# Patient Record
Sex: Female | Born: 1945 | Race: White | Hispanic: No | Marital: Married | Smoking: Never smoker
Health system: Southern US, Community
[De-identification: ages and names within clinical notes are randomized; demographics above are authoritative.]

## PROBLEM LIST (undated history)

## (undated) DIAGNOSIS — D649 Anemia, unspecified: Secondary | ICD-10-CM

## (undated) DIAGNOSIS — J312 Chronic pharyngitis: Secondary | ICD-10-CM

## (undated) DIAGNOSIS — Z8711 Personal history of peptic ulcer disease: Secondary | ICD-10-CM

## (undated) DIAGNOSIS — M810 Age-related osteoporosis without current pathological fracture: Secondary | ICD-10-CM

## (undated) DIAGNOSIS — L0291 Cutaneous abscess, unspecified: Secondary | ICD-10-CM

## (undated) DIAGNOSIS — Z8719 Personal history of other diseases of the digestive system: Secondary | ICD-10-CM

## (undated) DIAGNOSIS — K760 Fatty (change of) liver, not elsewhere classified: Secondary | ICD-10-CM

## (undated) DIAGNOSIS — J3489 Other specified disorders of nose and nasal sinuses: Secondary | ICD-10-CM

## (undated) DIAGNOSIS — K8020 Calculus of gallbladder without cholecystitis without obstruction: Secondary | ICD-10-CM

## (undated) DIAGNOSIS — R0683 Snoring: Secondary | ICD-10-CM

## (undated) DIAGNOSIS — I10 Essential (primary) hypertension: Secondary | ICD-10-CM

## (undated) DIAGNOSIS — L0390 Cellulitis, unspecified: Secondary | ICD-10-CM

## (undated) DIAGNOSIS — K602 Anal fissure, unspecified: Secondary | ICD-10-CM

## (undated) DIAGNOSIS — K5790 Diverticulosis of intestine, part unspecified, without perforation or abscess without bleeding: Secondary | ICD-10-CM

## (undated) HISTORY — DX: Diverticulosis of intestine, part unspecified, without perforation or abscess without bleeding: K57.90

## (undated) HISTORY — DX: Personal history of other diseases of the digestive system: Z87.19

## (undated) HISTORY — DX: Anemia, unspecified: D64.9

## (undated) HISTORY — PX: BREAST BIOPSY: SHX20

## (undated) HISTORY — PX: DILATION AND CURETTAGE OF UTERUS: SHX78

## (undated) HISTORY — DX: Cutaneous abscess, unspecified: L02.91

## (undated) HISTORY — DX: Cellulitis, unspecified: L03.90

## (undated) HISTORY — PX: ROTATOR CUFF REPAIR: SHX139

## (undated) HISTORY — DX: Personal history of peptic ulcer disease: Z87.11

## (undated) HISTORY — PX: PARTIAL HYSTERECTOMY: SHX80

## (undated) HISTORY — DX: Calculus of gallbladder without cholecystitis without obstruction: K80.20

## (undated) HISTORY — DX: Fatty (change of) liver, not elsewhere classified: K76.0

## (undated) HISTORY — DX: Anal fissure, unspecified: K60.2

## (undated) HISTORY — DX: Age-related osteoporosis without current pathological fracture: M81.0

## (undated) HISTORY — DX: Essential (primary) hypertension: I10

## (undated) HISTORY — DX: Other specified disorders of nose and nasal sinuses: J34.89

---

## 1983-05-07 HISTORY — PX: OTHER SURGICAL HISTORY: SHX169

## 1991-05-07 HISTORY — PX: BLADDER REPAIR: SHX76

## 1991-05-07 HISTORY — PX: LAPAROSCOPIC ENDOMETRIOSIS FULGURATION: SUR769

## 1998-05-06 HISTORY — PX: CHOLECYSTECTOMY: SHX55

## 1999-06-25 ENCOUNTER — Encounter: Payer: Self-pay | Admitting: Family Medicine

## 1999-06-25 ENCOUNTER — Encounter: Admission: RE | Admit: 1999-06-25 | Discharge: 1999-06-25 | Payer: Self-pay | Admitting: Family Medicine

## 2003-02-03 ENCOUNTER — Other Ambulatory Visit: Admission: RE | Admit: 2003-02-03 | Discharge: 2003-02-03 | Payer: Self-pay | Admitting: Obstetrics and Gynecology

## 2004-05-29 ENCOUNTER — Other Ambulatory Visit: Admission: RE | Admit: 2004-05-29 | Discharge: 2004-05-29 | Payer: Self-pay | Admitting: Obstetrics and Gynecology

## 2004-05-30 ENCOUNTER — Ambulatory Visit (HOSPITAL_COMMUNITY): Admission: RE | Admit: 2004-05-30 | Discharge: 2004-05-30 | Payer: Self-pay | Admitting: Obstetrics and Gynecology

## 2004-05-30 ENCOUNTER — Encounter: Admission: RE | Admit: 2004-05-30 | Discharge: 2004-05-30 | Payer: Self-pay | Admitting: Obstetrics and Gynecology

## 2004-06-20 ENCOUNTER — Ambulatory Visit: Payer: Self-pay | Admitting: Internal Medicine

## 2004-09-04 ENCOUNTER — Ambulatory Visit: Payer: Self-pay | Admitting: Internal Medicine

## 2004-09-18 ENCOUNTER — Ambulatory Visit: Payer: Self-pay | Admitting: Internal Medicine

## 2004-09-28 ENCOUNTER — Ambulatory Visit: Payer: Self-pay | Admitting: Internal Medicine

## 2004-09-28 HISTORY — PX: COLONOSCOPY: SHX174

## 2004-12-28 ENCOUNTER — Ambulatory Visit: Payer: Self-pay | Admitting: Internal Medicine

## 2005-04-12 ENCOUNTER — Ambulatory Visit: Payer: Self-pay | Admitting: Internal Medicine

## 2005-06-18 ENCOUNTER — Other Ambulatory Visit: Admission: RE | Admit: 2005-06-18 | Discharge: 2005-06-18 | Payer: Self-pay | Admitting: Obstetrics and Gynecology

## 2006-02-19 ENCOUNTER — Ambulatory Visit: Payer: Self-pay | Admitting: Internal Medicine

## 2006-08-12 ENCOUNTER — Ambulatory Visit: Payer: Self-pay | Admitting: Cardiology

## 2006-08-25 ENCOUNTER — Encounter: Payer: Self-pay | Admitting: Cardiovascular Disease

## 2006-08-25 ENCOUNTER — Ambulatory Visit: Payer: Self-pay

## 2006-09-02 ENCOUNTER — Ambulatory Visit: Payer: Self-pay | Admitting: Internal Medicine

## 2007-07-03 ENCOUNTER — Ambulatory Visit: Payer: Self-pay | Admitting: Internal Medicine

## 2007-07-03 DIAGNOSIS — M810 Age-related osteoporosis without current pathological fracture: Secondary | ICD-10-CM

## 2007-07-03 DIAGNOSIS — J069 Acute upper respiratory infection, unspecified: Secondary | ICD-10-CM | POA: Insufficient documentation

## 2007-07-03 HISTORY — DX: Age-related osteoporosis without current pathological fracture: M81.0

## 2007-07-22 ENCOUNTER — Ambulatory Visit: Payer: Self-pay | Admitting: Internal Medicine

## 2007-07-22 DIAGNOSIS — N390 Urinary tract infection, site not specified: Secondary | ICD-10-CM | POA: Insufficient documentation

## 2007-07-22 LAB — CONVERTED CEMR LAB
Bilirubin Urine: NEGATIVE
Glucose, Urine, Semiquant: NEGATIVE
Ketones, urine, test strip: NEGATIVE
Nitrite: NEGATIVE
Specific Gravity, Urine: <1.005
Urobilinogen, UA: NEGATIVE
pH: 6.5

## 2008-05-06 HISTORY — PX: SHOULDER ARTHROSCOPY: SHX128

## 2008-12-26 ENCOUNTER — Ambulatory Visit: Payer: Self-pay | Admitting: Family Medicine

## 2009-03-11 ENCOUNTER — Ambulatory Visit: Payer: Self-pay | Admitting: Family Medicine

## 2009-03-11 DIAGNOSIS — J019 Acute sinusitis, unspecified: Secondary | ICD-10-CM | POA: Insufficient documentation

## 2009-06-02 ENCOUNTER — Ambulatory Visit: Payer: Self-pay | Admitting: Internal Medicine

## 2009-06-02 DIAGNOSIS — J3489 Other specified disorders of nose and nasal sinuses: Secondary | ICD-10-CM

## 2009-06-02 HISTORY — DX: Other specified disorders of nose and nasal sinuses: J34.89

## 2009-07-04 ENCOUNTER — Ambulatory Visit: Payer: Self-pay | Admitting: Internal Medicine

## 2009-07-04 DIAGNOSIS — L0390 Cellulitis, unspecified: Secondary | ICD-10-CM

## 2009-07-04 DIAGNOSIS — L0291 Cutaneous abscess, unspecified: Secondary | ICD-10-CM | POA: Insufficient documentation

## 2009-07-04 HISTORY — DX: Cutaneous abscess, unspecified: L02.91

## 2009-07-04 HISTORY — DX: Cellulitis, unspecified: L03.90

## 2010-01-03 ENCOUNTER — Encounter: Admission: RE | Admit: 2010-01-03 | Discharge: 2010-01-03 | Payer: Self-pay | Admitting: Obstetrics and Gynecology

## 2010-06-05 NOTE — Assessment & Plan Note (Signed)
Summary: POSSIBLE EAR INFECTION // RS   Vital Signs:  Patient profile:   65 year old female Weight:      181 pounds Temp:     98.8 degrees F oral BP sitting:   168 / 90  (left arm) Cuff size:   regular  Vitals Entered By: Raechel Ache, RN (June 02, 2009 4:11 PM) CC: C/o head and ear pressure- recent flying.   CC:  C/o head and ear pressure- recent flying.Marland Kitchen  History of Present Illness:  65 year old patient who presents with a several day history of sinus pressure congestion.  she has also noted some discomfort in both ears.  She has been doing a considerable amount of flying.  Denies any fever, purulent drainage, hearing loss.  Denies cough or shortness of breath.  She has had some sinus infections in the past.  Denies any localized sinus pain or dental pain  Allergies: No Known Drug Allergies  Past History:  Past Medical History: Reviewed history from 07/03/2007 and no changes required. Thyroid Problem Osteoporosis endometriosis  Review of Systems  The patient denies anorexia, fever, weight loss, weight gain, vision loss, decreased hearing, hoarseness, chest pain, syncope, dyspnea on exertion, peripheral edema, prolonged cough, headaches, hemoptysis, abdominal pain, melena, severe indigestion/heartburn, hematuria, incontinence, genital sores, muscle weakness, suspicious skin lesions, transient blindness, difficulty walking, depression, unusual weight change, abnormal bleeding, enlarged lymph nodes, angioedema, and breast masses.    Physical Exam  General:  Well-developed,well-nourished,in no acute distress; alert,appropriate and cooperative throughout examination Head:  Normocephalic and atraumatic without obvious abnormalities. No apparent alopecia or balding. Eyes:  No corneal or conjunctival inflammation noted. EOMI. Perrla. Funduscopic exam benign, without hemorrhages, exudates or papilledema. Vision grossly normal. Ears:  External ear exam shows no significant  lesions or deformities.  Otoscopic examination reveals clear canals, tympanic membranes are intact bilaterally without bulging, retraction, inflammation or discharge. Hearing is grossly normal bilaterally. Nose:  External nasal examination shows no deformity or inflammation. Nasal mucosa are pink and moist without lesions or exudates. Mouth:  Oral mucosa and oropharynx without lesions or exudates.  Teeth in good repair. Neck:  No deformities, masses, or tenderness noted. Lungs:  Normal respiratory effort, chest expands symmetrically. Lungs are clear to auscultation, no crackles or wheezes.   Impression & Recommendations:  Problem # 1:  OTHER DISEASES OF NASAL CAVITY AND SINUSES (ICD-478.19)  Problem # 2:  URI (ICD-465.9)  Complete Medication List: 1)  Evista 60 Mg Tabs (Raloxifene hcl) .Marland Kitchen.. 1 once daily 2)  Nasonex 50 Mcg/act Susp (Mometasone furoate) .... Used twice daily  Patient Instructions: 1)  to use Afrin nasal spray twice daily for 3 to 4 days 2)  Nasonex twice daily 3)  Use warm moist compresses, and over the counter decongestants ( only as directed). Call if no improvement in 5-7 days, sooner if increasing pain, fever, or new symptoms. 4)  Check your Blood Pressure regularly. If it is above:  150/90 you should make an appointment.

## 2010-06-05 NOTE — Assessment & Plan Note (Signed)
Summary: stomach red/painful/njr   Vital Signs:  Patient profile:   65 year old Jensen Weight:      180 pounds Temp:     98.7 degrees F oral BP sitting:   122 / 74  (right arm) Cuff size:   regular  Vitals Entered By: Duard Brady LPN (July 04, 1608 4:00 PM) CC: c/o red rash and pain on/at old abd surgical site (1993) , using otc hydrocort cream - not improving Is Patient Diabetic? No   CC:  c/o red rash and pain on/at old abd surgical site (1993)  and using otc hydrocort cream - not improving.  History of Present Illness: 65 year old patient who is seen today complaining of a painful rash involving her lower abdominal region.  She has had  multiple operations in her lower abdominal region.  She first noticed some redness about 7 days ago, and this has progressively worsened and more recently has become more painful.  Denies any fever, chills, or other systemic complaints.  She has been using topical cortisone without much benefit. She has osteoporosis, which has been treated with Evista.  She is on no new medications  Preventive Screening-Counseling & Management  Alcohol-Tobacco     Smoking Status: never  Allergies (verified): No Known Drug Allergies  Past History:  Past Medical History: Reviewed history from 02/Jamie/2009 and no changes required. Thyroid Problem Osteoporosis endometriosis  Review of Systems       The patient complains of suspicious skin lesions.  The patient denies anorexia, fever, weight loss, weight gain, vision loss, decreased hearing, hoarseness, chest pain, syncope, dyspnea on exertion, peripheral edema, prolonged cough, headaches, hemoptysis, abdominal pain, melena, hematochezia, severe indigestion/heartburn, hematuria, incontinence, genital sores, muscle weakness, transient blindness, difficulty walking, depression, unusual weight change, abnormal bleeding, enlarged lymph nodes, angioedema, and breast masses.    Physical Exam  General:   overweight-appearing.  ad 30 her aoverweight-appearing.   Abdomen:  a transverse scar was noted in the lower abdominal region; there is erythema associated with the scar, especially at either end.  There is tenderness along the entire length of the scar, but there was an area of increased induration and discomfort involving the left superior aspect of the scar.   Impression & Recommendations:  Problem # 1:  CELLULITIS/ABSCESS NOS (ICD-682.9)  Her updated medication list for this problem includes:    Cephalexin 500 Mg Caps (Cephalexin) ..... One three times daily  Her updated medication list for this problem includes:    Cephalexin 500 Mg Caps (Cephalexin) ..... One three times daily  Problem # 2:  OSTEOPOROSIS (ICD-733.00)  Her updated medication list for this problem includes:    Evista 60 Mg Tabs (Raloxifene hcl) .Marland Kitchen... 1 once daily  Her updated medication list for this problem includes:    Evista 60 Mg Tabs (Raloxifene hcl) .Marland Kitchen... 1 once daily  Complete Medication List: 1)  Evista 60 Mg Tabs (Raloxifene hcl) .Marland Kitchen.. 1 once daily 2)  Nasonex 50 Mcg/act Susp (Mometasone furoate) .... Used twice daily 3)  Miconazole Nitrate 2 % Crea (Miconazole nitrate) .... Use twice daily 4)  Cephalexin 500 Mg Caps (Cephalexin) .... One three times daily  Patient Instructions: 1)  Take your antibiotic as prescribed until ALL of it is gone, but stop if you develop a rash or swelling and contact our office as soon as possible. 2)  Take 400-600mg  of Ibuprofen (Advil, Motrin) with food every 4-6 hours as needed for relief of pain or comfort of fever.  Prescriptions: CEPHALEXIN 500 MG CAPS (CEPHALEXIN) one three times daily  #30 x 0   Entered and Authorized by:   Gordy Savers  MD   Signed by:   Gordy Savers  MD on 07/04/2009   Method used:   Print then Give to Patient   RxID:   [id number] MICONAZOLE NITRATE 2 % CREA (MICONAZOLE NITRATE) use twice daily  #60 gm x 0   Entered and  Authorized by:   Gordy Savers  MD   Signed by:   Gordy Savers  MD on 07/04/2009   Method used:   Print then Give to Patient   RxID:   [id number]

## 2010-06-12 ENCOUNTER — Encounter: Payer: Self-pay | Admitting: Internal Medicine

## 2010-06-12 ENCOUNTER — Ambulatory Visit (INDEPENDENT_AMBULATORY_CARE_PROVIDER_SITE_OTHER): Payer: Federal, State, Local not specified - PPO | Admitting: Internal Medicine

## 2010-06-12 VITALS — BP 118/78 | Temp 98.5°F | Ht 61.0 in | Wt 185.0 lb

## 2010-06-12 DIAGNOSIS — J069 Acute upper respiratory infection, unspecified: Secondary | ICD-10-CM

## 2010-06-12 MED ORDER — HYDROCODONE-HOMATROPINE 5-1.5 MG/5ML PO SYRP: 2.5000 mL | ORAL_SOLUTION | Freq: Four times a day (QID) | ORAL | Status: AC | PRN

## 2010-06-12 NOTE — Patient Instructions (Signed)
Get plenty of rest, Drink lots of  clear liquids, and use Tylenol or ibuprofen for fever and discomfort.    Call or return to clinic prn if these symptoms worsen or fail to improve as anticipated.  

## 2010-06-12 NOTE — Progress Notes (Signed)
  Subjective:    Patient ID: Jamie Jensen, female    DOB: 10/28/1945, 65 y.o.   MRN: [id number]  HPI  65 year old patient who presents with a 7-day history of URI symptoms.  She has had head and chest congestion with dry, nonproductive cough.  She has felt achy.  There is been some low-grade fever, but not in excess of 100 degrees.  She has been taking Mucinex, which has been helpful.  Cough is interfered with sleep.  She does have a history of allergic rhinitis.  She has osteoporosis in the present time takes no medications other than he vista   Review of Systems  Constitutional: Negative.   HENT: Negative for hearing loss, congestion, sore throat, rhinorrhea, dental problem, sinus pressure and tinnitus.   Eyes: Negative for pain, discharge and visual disturbance.  Respiratory: Positive for cough. Negative for shortness of breath.   Cardiovascular: Negative for chest pain, palpitations and leg swelling.  Gastrointestinal: Negative for nausea, vomiting, abdominal pain, diarrhea, constipation, blood in stool and abdominal distention.  Genitourinary: Negative for dysuria, urgency, frequency, hematuria, flank pain, vaginal bleeding, vaginal discharge, difficulty urinating, vaginal pain and pelvic pain.  Musculoskeletal: Negative for joint swelling, arthralgias and gait problem.  Skin: Negative for rash.  Neurological: Negative for dizziness, syncope, speech difficulty, weakness, numbness and headaches.  Hematological: Negative for adenopathy. Does not bruise/bleed easily.  Psychiatric/Behavioral: Negative for behavioral problems, dysphoric mood and agitation. The patient is not nervous/anxious.        Objective:   Physical Exam  Constitutional: She is oriented to person, place, and time. She appears well-developed and well-nourished.  HENT:  Head: Normocephalic.  Right Ear: External ear normal.  Left Ear: External ear normal.  Mouth/Throat: Oropharynx is clear and moist.  Eyes:  Conjunctivae and EOM are normal. Pupils are equal, round, and reactive to light.  Neck: Normal range of motion. Neck supple. No thyromegaly present.  Cardiovascular: Normal rate, regular rhythm, normal heart sounds and intact distal pulses.   Pulmonary/Chest: Effort normal and breath sounds normal.  Abdominal: Soft. Bowel sounds are normal. She exhibits no mass. There is no tenderness.  Musculoskeletal: Normal range of motion.  Lymphadenopathy:    She has no cervical adenopathy.  Neurological: She is alert and oriented to person, place, and time.  Skin: Skin is warm and dry. No rash noted.  Psychiatric: She has a normal mood and affect. Her behavior is normal.          Assessment & Plan:  Lower URI-will treat symptomatically.  A prescription for Hydromet, dispensed.

## 2010-09-21 NOTE — Assessment & Plan Note (Signed)
[facility]                            CARDIOLOGY OFFICE NOTE   MARY, SECORD                      MRN:          [id number]  DATE:08/12/2006                            DOB:          09/15/1945    I was asked by Dr. Thressa Sheller to evaluate Jamie Jensen with several month  history of dizzy spells and tachy palpitations.   HISTORY OF PRESENT ILLNESS:  She is 65 years of age, married, and has 2  children. She works as an Retail buyer.   She travels quite a bit and most recently has been on the road 3 to 4  nights a week. She says she is very exhausted.   She has noted several episodes where she is sitting and talking to an  agent, feeling dizzy and lightheaded. She has no other neurological  symptoms. She then feels like she might pass out,  which triggers a  cough. She coughs a couple of times and then comes around.   She denies any chest pressure, shortness of breath, diaphoresis, nausea  or vomiting or tachy palpitations during those spells. She says when she  comes back around completely, she does feel her heart beating fast. She  subsequently gets a little light headache.   She is very athletic and enjoys dancing, skiing, and other vigorous  activities. She has never had any symptoms of syncope or presyncope with  these kinds of activities.   She has no previous cardiac history. She has had no previous heart  murmur. There is no family history of premature coronary disease or  collapse or syncope.   PAST MEDICAL HISTORY:  She has no history of allergies.   CURRENT MEDICATIONS:  Evista and Fosamax once a week.  She takes a  multivitamin.   She does not smoke. She has 1 glass of wine per week. She drinks 2 cups  of coffee a day.   PAST SURGICAL HISTORY:  1. C-section.  2. Hysterectomy.  3. Two breast biopsies.  4. Colonoscopy.  5. Cholecystectomy.   FAMILY HISTORY:  Is really noncontributory, as mentioned above.   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 She  has 2 children. She is on her second marriage.   REVIEW OF SYSTEMS:  Other than the HPI, she has some fatigue and just  feels exhausted.   She had blood work in the past year by Dr. Henderson Cloud, which she says was  normal.   There is no history of thyroid disease.   Her blood pressure today is 121/87, pulse 68 and regular. Her height is  5 feet 1 inch. She weighs 175.  HEENT: Normocephalic, atraumatic. PERRLA. Extraocular movements intact.  Sclerae are clear. Facial symmetry is normal.  NECK: Supple. Carotid upstrokes are equal bilaterally without bruits.  There is no JVD.  Thyroid is not enlarged. Trachea is midline.  LUNGS:  Are clear to auscultation.  HEART: Reveals a nondisplaced PMI. She has normal S1, S2 without  gallop, rub, or murmur.  ABDOMEN: Soft with good bowel sounds. There is no  midline bruit. There  is no hepatomegaly.  EXTREMITIES: Reveals no cyanosis, clubbing or edema. Pulses are intact.  NEURO: Grossly intact.  SKIN: Intact.   EKG shows normal sinus rhythm with poor R progression across the  anterior precordium, which is probably not significant.   ASSESSMENT:  1. Dizziness with presyncope.  2. Tachy palpitations after these events and also spontaneously      sometimes at night.   PLAN:  1. A 2D echo to rule out any structural heart disease.  2. CardioNet monitor.     Thomas C. Daleen Squibb, MD, [facility]  Electronically Signed    TCW/MedQ  DD: 08/12/2006  DT: 08/12/2006  Job #: [id number]   cc:   Guy Sandifer. Henderson Cloud, M.D.  Gordy Savers, MD

## 2010-10-02 ENCOUNTER — Ambulatory Visit (INDEPENDENT_AMBULATORY_CARE_PROVIDER_SITE_OTHER): Payer: Federal, State, Local not specified - PPO | Admitting: Internal Medicine

## 2010-10-02 ENCOUNTER — Telehealth: Payer: Self-pay | Admitting: Internal Medicine

## 2010-10-02 ENCOUNTER — Encounter: Payer: Self-pay | Admitting: Internal Medicine

## 2010-10-02 VITALS — BP 118/80 | Temp 98.8°F | Wt 188.0 lb

## 2010-10-02 DIAGNOSIS — T6391XA Toxic effect of contact with unspecified venomous animal, accidental (unintentional), initial encounter: Secondary | ICD-10-CM

## 2010-10-02 DIAGNOSIS — T63441A Toxic effect of venom of bees, accidental (unintentional), initial encounter: Secondary | ICD-10-CM

## 2010-10-02 NOTE — Patient Instructions (Signed)
Force fluids  Take Allegra or Alavert daily  Call if unimproved  Call or return to clinic prn if these symptoms worsen or fail to improve as anticipated.

## 2010-10-02 NOTE — Telephone Encounter (Signed)
Appt scheduled with Dr. Kirtland Bouchard today.

## 2010-10-02 NOTE — Progress Notes (Signed)
  Subjective:    Patient ID: Jamie Jensen, female    DOB: [birth year], 65 y.o.   MRN: [id number]  HPI 65 year old patient who is seen today following multiple hornet stings yesterday. These occurred on the facial region especially about the right eye and also heard extremities. She also states that for the past couple of weeks she has had number episodes of lightheadedness and near syncope. She takes no medications that might aggravate hypotension   Review of Systems     Objective:   Physical Exam  Constitutional: She is oriented to person, place, and time. She appears well-developed and well-nourished.       Blood pressure 120/80  HENT:  Head: Normocephalic.  Right Ear: External ear normal.  Left Ear: External ear normal.  Mouth/Throat: Oropharynx is clear and moist.  Eyes: Conjunctivae and EOM are normal. Pupils are equal, round, and reactive to light.  Neck: Normal range of motion. Neck supple. No thyromegaly present.  Cardiovascular: Normal rate, regular rhythm, normal heart sounds and intact distal pulses.   Pulmonary/Chest: Effort normal and breath sounds normal.  Abdominal: Soft. Bowel sounds are normal. She exhibits no mass. There is no tenderness.  Musculoskeletal: Normal range of motion.  Lymphadenopathy:    She has no cervical adenopathy.  Neurological: She is alert and oriented to person, place, and time.  Skin: Skin is warm and dry. No rash noted.       Right periorbital swelling she had areas of soft tissue swelling involving multiple areas on her arms and left leg  Psychiatric: She has a normal mood and affect. Her behavior is normal.          Assessment & Plan:  Multiple hornet stings  We'll continue with the antihistamines. It was suggested that she try a once a day nonsedating antihistamine such as Allegra or Alavert. She will be treated with 80 mg of Depo-Medrol today  She will force fluids with the warmer weather. If she experiences any further near  syncope will recontact the office

## 2010-10-02 NOTE — Telephone Encounter (Signed)
Pt got stung by hornets yesterday. Has 2 hornet stings over rt eye. Vision slightly impaired due to swelling. Pt also has one sting on arm and one on leg. Pt is wondering what she could do?

## 2010-11-26 ENCOUNTER — Other Ambulatory Visit (HOSPITAL_COMMUNITY): Payer: Self-pay | Admitting: Obstetrics and Gynecology

## 2010-11-26 DIAGNOSIS — R102 Pelvic and perineal pain: Secondary | ICD-10-CM

## 2010-11-27 ENCOUNTER — Other Ambulatory Visit: Payer: Self-pay | Admitting: Obstetrics and Gynecology

## 2010-11-28 ENCOUNTER — Ambulatory Visit (HOSPITAL_COMMUNITY)
Admission: RE | Admit: 2010-11-28 | Discharge: 2010-11-28 | Disposition: A | Discharge status: Home / Self Care | Payer: Federal, State, Local not specified - PPO | Source: Ambulatory Visit | Attending: Obstetrics and Gynecology | Admitting: Obstetrics and Gynecology

## 2010-11-28 DIAGNOSIS — Z90710 Acquired absence of both cervix and uterus: Secondary | ICD-10-CM | POA: Insufficient documentation

## 2010-11-28 DIAGNOSIS — K7689 Other specified diseases of liver: Secondary | ICD-10-CM | POA: Insufficient documentation

## 2010-11-28 DIAGNOSIS — R102 Pelvic and perineal pain: Secondary | ICD-10-CM

## 2010-11-28 DIAGNOSIS — E279 Disorder of adrenal gland, unspecified: Secondary | ICD-10-CM | POA: Insufficient documentation

## 2010-11-28 DIAGNOSIS — K5730 Diverticulosis of large intestine without perforation or abscess without bleeding: Secondary | ICD-10-CM | POA: Insufficient documentation

## 2010-11-28 DIAGNOSIS — N949 Unspecified condition associated with female genital organs and menstrual cycle: Secondary | ICD-10-CM | POA: Insufficient documentation

## 2010-11-28 MED ORDER — IOHEXOL 300 MG/ML  SOLN
100.0000 mL | Freq: Once | INTRAMUSCULAR | Status: AC | PRN
  Administered 2010-11-28: 100 mL via INTRAVENOUS

## 2012-07-17 ENCOUNTER — Other Ambulatory Visit: Payer: Self-pay | Admitting: Internal Medicine

## 2012-07-17 DIAGNOSIS — E049 Nontoxic goiter, unspecified: Secondary | ICD-10-CM

## 2012-07-24 ENCOUNTER — Ambulatory Visit
Admission: RE | Admit: 2012-07-24 | Discharge: 2012-07-24 | Disposition: A | Discharge status: Home / Self Care | Payer: Federal, State, Local not specified - PPO | Source: Ambulatory Visit | Attending: Internal Medicine | Admitting: Internal Medicine

## 2012-07-24 DIAGNOSIS — E049 Nontoxic goiter, unspecified: Secondary | ICD-10-CM

## 2012-07-29 ENCOUNTER — Other Ambulatory Visit: Payer: Self-pay | Admitting: Internal Medicine

## 2012-07-29 DIAGNOSIS — E049 Nontoxic goiter, unspecified: Secondary | ICD-10-CM

## 2012-08-05 ENCOUNTER — Encounter: Payer: Self-pay | Admitting: Cardiovascular Disease

## 2012-08-07 ENCOUNTER — Ambulatory Visit (INDEPENDENT_AMBULATORY_CARE_PROVIDER_SITE_OTHER): Payer: Federal, State, Local not specified - PPO | Admitting: Cardiovascular Disease

## 2012-08-07 ENCOUNTER — Encounter: Payer: Self-pay | Admitting: Cardiovascular Disease

## 2012-08-07 VITALS — BP 120/80 | HR 89 | Wt 179.0 lb

## 2012-08-07 DIAGNOSIS — R55 Syncope and collapse: Secondary | ICD-10-CM | POA: Insufficient documentation

## 2012-08-07 DIAGNOSIS — R0602 Shortness of breath: Secondary | ICD-10-CM

## 2012-08-07 DIAGNOSIS — E049 Nontoxic goiter, unspecified: Secondary | ICD-10-CM | POA: Insufficient documentation

## 2012-08-07 NOTE — Progress Notes (Signed)
Patient ID: Jamie Jensen, female   DOB: [birth year], 67 y.o.   MRN: [id number] 67 y.o. referred by Dr Renne Crigler for presyncope.  History of obesity and HTN Over the years has had steriotypical episodes of recumbant resting syncope.  No palpitations chest pain. Feels like she is going out and then gets headache and some mild dyspnea.  Post event feels heart beating hard and fast. Previous w/u over 5 years ago normal No chestp pain Walks at brisk pace regularly with no issues. Spells are not related to exercise, postural changes.  Last one this Sunday at church and felt better in about 30 mintues  Has a multinodular gointer No TSH on file [place] with no worrisome nodules  ROS: Denies fever, malais, weight loss, blurry vision, decreased visual acuity, cough, sputum, SOB, hemoptysis, pleuritic pain, palpitaitons, heartburn, abdominal pain, melena, lower extremity edema, claudication, or rash.  All other systems reviewed and negative   General: Affect appropriate Healthy:  appears stated age HEENT: normal Neck supple with no adenopathy JVP normal no bruits no thyromegaly Lungs clear with no wheezing and good diaphragmatic motion Heart:  S1/S2 no murmur,rub, gallop or click PMI normal Abdomen: benighn, BS positve, no tenderness, no AAA no bruit.  No HSM or HJR Distal pulses intact with no bruits No edema Neuro non-focal Skin warm and dry No muscular weakness  Medications Current Outpatient Prescriptions  Medication Sig Dispense Refill  . triamterene-hydrochlorothiazide (DYAZIDE) 37.5-25 MG per capsule Take 1 capsule by mouth every morning.       No current facility-administered medications for this visit.    Allergies Review of patient's allergies indicates no known allergies.  Family History: No family history on file.  Social History: History   Social History  . Marital Status: Married    Spouse Name: N/A    Number of Children: N/A  . Years of Education: N/A   Occupational History  .  Not on file.   Social History Main Topics  . Smoking status: Never Smoker   . Smokeless tobacco: Not on file  . Alcohol Use: Yes     Comment: occasionaly  . Drug Use: No  . Sexually Active: Not on file   Other Topics Concern  . Not on file   Social History Narrative  . No narrative on file    Electrocardiogram:  SR rte 89 LAD PAC poor R wave progression  Assessment and Plan

## 2012-08-07 NOTE — Assessment & Plan Note (Signed)
Given fairly normal ECG and exam and nonexertional symptoms not likely to be related to heart.  F/U stress echo to document normal HR and BP response and r/o structural heart disease

## 2012-08-07 NOTE — Assessment & Plan Note (Signed)
Encouraged her to have TSH/T4 with Dr Renne Crigler.  Left lobe palpable but no worrisome nodules on exam

## 2012-08-07 NOTE — Patient Instructions (Addendum)
Your physician recommends that you schedule a follow-up appointment in:   AS NEEDED   Your physician recommends that you continue on your current medications as directed. Please refer to the Current Medication list given to you today.   Your physician has requested that you have a stress echocardiogram. For further information please visit [email or web address]. Please follow instruction sheet as given.  

## 2012-08-17 ENCOUNTER — Encounter: Payer: Self-pay | Admitting: Cardiovascular Disease

## 2012-08-17 ENCOUNTER — Ambulatory Visit (HOSPITAL_COMMUNITY): Payer: Federal, State, Local not specified - PPO | Attending: Cardiovascular Disease

## 2012-08-17 ENCOUNTER — Ambulatory Visit (HOSPITAL_COMMUNITY): Payer: Federal, State, Local not specified - PPO | Attending: Cardiovascular Disease | Admitting: Radiology

## 2012-08-17 DIAGNOSIS — R0989 Other specified symptoms and signs involving the circulatory and respiratory systems: Secondary | ICD-10-CM

## 2012-08-17 DIAGNOSIS — R55 Syncope and collapse: Secondary | ICD-10-CM | POA: Insufficient documentation

## 2012-08-17 NOTE — Progress Notes (Signed)
Ms. Stress Echocardiogram performed.

## 2013-03-04 ENCOUNTER — Telehealth: Payer: Self-pay | Admitting: Internal Medicine

## 2013-03-04 NOTE — Telephone Encounter (Signed)
Patient has seen her PCP and was given medications for GERD. She continues to have RUQ pain right under her rib cage. Offered OV tomorrow or 03/11/13. Patient could not do these OV. She is going out of town until week of Thanksgiving. Scheduled on 03/29/13 at 8:15 AM.

## 2013-03-10 ENCOUNTER — Encounter: Payer: Self-pay | Admitting: *Deleted

## 2013-03-29 ENCOUNTER — Ambulatory Visit (INDEPENDENT_AMBULATORY_CARE_PROVIDER_SITE_OTHER): Payer: Medicare Other | Admitting: Internal Medicine

## 2013-03-29 ENCOUNTER — Other Ambulatory Visit (INDEPENDENT_AMBULATORY_CARE_PROVIDER_SITE_OTHER): Payer: Medicare Other

## 2013-03-29 ENCOUNTER — Encounter: Payer: Self-pay | Admitting: Internal Medicine

## 2013-03-29 VITALS — BP 132/80 | HR 76 | Ht 61.0 in | Wt 184.0 lb

## 2013-03-29 DIAGNOSIS — R1031 Right lower quadrant pain: Secondary | ICD-10-CM | POA: Diagnosis not present

## 2013-03-29 DIAGNOSIS — K624 Stenosis of anus and rectum: Secondary | ICD-10-CM

## 2013-03-29 DIAGNOSIS — R1013 Epigastric pain: Secondary | ICD-10-CM

## 2013-03-29 MED ORDER — HYOSCYAMINE SULFATE 0.125 MG SL SUBL: 0.1250 mg | SUBLINGUAL_TABLET | SUBLINGUAL | Status: DC | PRN

## 2013-03-29 MED ORDER — OMEPRAZOLE 20 MG PO CPDR: 20.0000 mg | DELAYED_RELEASE_CAPSULE | Freq: Every day | ORAL | Status: DC

## 2013-03-29 NOTE — Progress Notes (Signed)
Jamie Jensen 1945-09-04 MRN [id number]  History of Present Illness:  This is a 67 year old, white female with chronic right lower quadrant abdominal pain which is sharp, localized anteriorly and worse with walking. It also bothers her at rest; for instance, she woke up with it this morning.  She also has epigastric discomfort and heartburn associated with occasional solid food dysphagia. We saw her in 2006 for a screening colonoscopy which showed mild diverticulosis of the left colon. 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 She underwent a cholecystectomy within the last 5 years. She describes being constipated and having occasional diarrhea as well. She denies rectal bleeding. There is a history of an anal fissure. Her weight has remained stable, overweight. There is a history of severe endometriosis necessitating multiple surgeries. She has had 2 C-sections, a vaginal hysterectomy and resection of endometriomas in [place] approximately 15 years ago. She had a postoperative complication of perforated bladder which necessitated a suprapubic catheter and subsequent reoperation. She was told to have pelvic adhesions.   Past Medical History  Diagnosis Date  . Other diseases of nasal cavity and sinuses(478.19) 06/02/2009  . CELLULITIS/ABSCESS NOS 07/04/2009  . OSTEOPOROSIS 07/03/2007  . Endometriosis   . Diverticulosis   . Anal fissure   . Gallstones   . Hypertension    Past Surgical History  Procedure Laterality Date  . Cesarean section      x 2  . Partial hysterectomy    . Breast biopsy      x 2  . Cholecystectomy    . Complete hysterectomy      following partial hysterectomy  . Laparoscopic endometriosis fulguration      with subsequent bladder perforation  . Bladder repair      post perforation during surgery for endometriosis    reports that she has never smoked. She does not have any smokeless tobacco history on file.  She reports that she drinks alcohol. She reports that she does not use illicit drugs. family history includes Breast cancer in her maternal aunt; Heart disease in her father; Kidney cancer in her mother; Pancreatic cancer in her mother; Prostate cancer in an other family member. No Known Allergies      Review of Systems: Occasional solid food dysphagia. Heartburn, epigastric pain. Diarrhea and constipation  The remainder of the 10 point ROS is negative except as outlined in H&P   Physical Exam: General appearance  Well developed, in no distress. Eyes- non icteric. HEENT nontraumatic, normocephalic. Mouth no lesions, tongue papillated, no cheilosis. Neck supple without adenopathy, goiter right lobe of the thyroid, no carotid bruits, no JVD. Lungs Clear to auscultation bilaterally. Cor normal S1, normal S2, regular rhythm, no murmur,  quiet precordium. Abdomen: Soft and relaxed, mildly obese. Multiple laparoscopic scars. Normoactive bowel sounds. Tender along her right colon from right lower to right upper quadrant. No palpable mass. Liver edge at coatal margin. No CVA tenderness. Rectal: Anal stenosis. Painful digital exam. Small amount of Hemoccult negative stool. Extremities no pedal edema. Skin no lesions. Neurological alert and oriented x 3. Psychological normal mood and affect.  Assessment and Plan:  Problem #100 67 year old white female with intermittent right lower quadrant abdominal pain which could be multifactorial in that she has a history of severe endometriosis and most likely has adhesions. She also has functional constipation which is most likely contributing to the abdominal discomfort. She has anal stenosis on my exam today  which creates an additional problem with evacuation. She will start Colace 100 mg daily and we will give her Levsin sublingual 0.125 mg by mouth. For her right lower quadrant abdominal pain, we will proceed with a CT scan of the abdomen and pelvis to look  for malignancy as well as followup on the adrenal lesion.   Problem #2 Dysphagia and epigastric pain. I suspect a hiatal hernia or esophagitis. We will start her on omeprazole 20 mg daily and will schedule an upper endoscopy to rule out peptic ulcer disease.  Problem #3 Goiter. She has an appointment with Dr. Talmage Nap for evaluation.  Problem #4 Colorectal screening. Patient's last colonoscopy was in May 2006. A recall colonoscopy will be due in May 2016 or earlier if the right lower quadrant abdominal pain persists.   03/29/2013 Jamie Jensen

## 2013-03-29 NOTE — Patient Instructions (Addendum)
You have been scheduled for an endoscopy with propofol. Please follow written instructions given to you at your visit today. If you use inhalers (even only as needed), please bring them with you on the day of your procedure. Your physician has requested that you go to www.startemmi.com and enter the access code given to you at your visit today. This web site gives a general overview about your procedure. However, you should still follow specific instructions given to you by our office regarding your preparation for the procedure.  Your physician has requested that you go to the basement for the following lab work before leaving today: BUN, Creatinine  We have sent the following medications to your pharmacy for you to pick up at your convenience: Omeprazole Levsin  You have been scheduled for a CT scan of the abdomen and pelvis at [facility] ([address]---this is in the same building as [facility]).   You are scheduled on 03/30/13 at 10:00 am. You should arrive 15 minutes prior to your appointment time for registration. Please follow the written instructions below on the day of your exam:  WARNING: IF YOU ARE ALLERGIC TO IODINE/X-RAY DYE, PLEASE NOTIFY RADIOLOGY IMMEDIATELY AT [phone]! YOU WILL BE GIVEN A 13 HOUR PREMEDICATION PREP.  1) Do not eat or drink anything after 6:00 am (4 hours prior to your test) 2) You have been given 2 bottles of oral contrast to drink. The solution may taste better if refrigerated, but do NOT add ice or any other liquid to this solution. Shake well before drinking.    Drink 1 bottle of contrast @ 8:00 am (2 hours prior to your exam)  Drink 1 bottle of contrast @ 9:00 am (1 hour prior to your exam)  You may take any medications as prescribed with a small amount of water except for the following: Metformin, Glucophage, Glucovance, Avandamet, Riomet, Fortamet, Actoplus Met, Janumet, Glumetza or Metaglip. The above medications must be  held the day of the exam AND 48 hours after the exam.  The purpose of you drinking the oral contrast is to aid in the visualization of your intestinal tract. The contrast solution may cause some diarrhea. Before your exam is started, you will be given a small amount of fluid to drink. Depending on your individual set of symptoms, you may also receive an intravenous injection of x-ray contrast/dye. Plan on being at [facility] for 30 minutes or long, depending on the type of exam you are having performed.  This test typically takes 30-45 minutes to complete.  If you have any questions regarding your exam or if you need to reschedule, you may call the CT department at [phone] between the hours of 8:00 am and 5:00 pm, Monday-Friday.  ________________________________________________________________________ CC: Dr Renne Crigler

## 2013-03-30 ENCOUNTER — Ambulatory Visit (INDEPENDENT_AMBULATORY_CARE_PROVIDER_SITE_OTHER)
Admission: RE | Admit: 2013-03-30 | Discharge: 2013-03-30 | Disposition: A | Discharge status: Home / Self Care | Payer: Medicare Other | Source: Ambulatory Visit | Attending: Internal Medicine | Admitting: Internal Medicine

## 2013-03-30 DIAGNOSIS — R1031 Right lower quadrant pain: Secondary | ICD-10-CM | POA: Diagnosis not present

## 2013-03-30 MED ORDER — IOHEXOL 300 MG/ML  SOLN
100.0000 mL | Freq: Once | INTRAMUSCULAR | Status: AC | PRN
  Administered 2013-03-30: 100 mL via INTRAVENOUS

## 2013-03-31 ENCOUNTER — Ambulatory Visit (AMBULATORY_SURGERY_CENTER): Payer: Medicare Other | Admitting: Internal Medicine

## 2013-03-31 ENCOUNTER — Encounter: Payer: Self-pay | Admitting: Internal Medicine

## 2013-03-31 VITALS — BP 102/52 | HR 59 | Temp 97.0°F | Resp 17 | Ht 61.0 in | Wt 184.0 lb

## 2013-03-31 DIAGNOSIS — K2100 Gastro-esophageal reflux disease with esophagitis, without bleeding: Secondary | ICD-10-CM | POA: Diagnosis not present

## 2013-03-31 DIAGNOSIS — K2960 Other gastritis without bleeding: Secondary | ICD-10-CM | POA: Diagnosis not present

## 2013-03-31 DIAGNOSIS — M810 Age-related osteoporosis without current pathological fracture: Secondary | ICD-10-CM | POA: Diagnosis not present

## 2013-03-31 DIAGNOSIS — E049 Nontoxic goiter, unspecified: Secondary | ICD-10-CM | POA: Diagnosis not present

## 2013-03-31 DIAGNOSIS — D131 Benign neoplasm of stomach: Secondary | ICD-10-CM

## 2013-03-31 DIAGNOSIS — K209 Esophagitis, unspecified without bleeding: Secondary | ICD-10-CM

## 2013-03-31 DIAGNOSIS — R1031 Right lower quadrant pain: Secondary | ICD-10-CM

## 2013-03-31 DIAGNOSIS — R109 Unspecified abdominal pain: Secondary | ICD-10-CM | POA: Diagnosis not present

## 2013-03-31 MED ORDER — SODIUM CHLORIDE 0.9 % IV SOLN: 500.0000 mL | INTRAVENOUS | Status: DC

## 2013-03-31 NOTE — Progress Notes (Signed)
Report to pacu rn, vss, bbs=clear 

## 2013-03-31 NOTE — Patient Instructions (Signed)

## 2013-03-31 NOTE — Progress Notes (Signed)
Called to room to assist during endoscopic procedure.  Patient ID and intended procedure confirmed with present staff. Received instructions for my participation in the procedure from the performing physician.  

## 2013-03-31 NOTE — Progress Notes (Signed)
Patient did not experience any of the following events: a burn prior to discharge; a fall within the facility; wrong site/side/patient/procedure/implant event; or a hospital transfer or hospital admission upon discharge from the facility. (G8907) Patient did not have preoperative order for IV antibiotic SSI prophylaxis. (G8918)  

## 2013-03-31 NOTE — Op Note (Signed)
[facility] [address] [place], [address]   ENDOSCOPY PROCEDURE REPORT  PATIENT: Jamie, Jensen  MR#: [id number] BIRTHDATE: 25-Jun-1945 , 67  yrs. old GENDER: Female ENDOSCOPIST: Hart Carwin, MD REFERRED BY:  Romero Liner, M.D. PROCEDURE DATE:  03/31/2013 PROCEDURE:  EGD w/ biopsy ASA CLASS:     Class II INDICATIONS:  Epigastric pain.   Dysphagia. hx of chole, on Omeprazole 20 mg/day MEDICATIONS: MAC sedation, administered by CRNA and propofol (Diprivan) 150mg  IV TOPICAL ANESTHETIC: Cetacaine Spray  DESCRIPTION OF PROCEDURE: After the risks benefits and alternatives of the procedure were thoroughly explained, informed consent was obtained.  The LB [id number] endoscope was introduced through the mouth and advanced to the second portion of the duodenum. Without limitations.  The instrument was slowly withdrawn as the mucosa was fully examined.      Esophagus: proximal and mid-esophageal mucosa appeared normal. There were several linear erosions and superficial ulcerations at the GE junction. Erosions measured at least 5 mm. There was no stricture. There was edema at the GE junction. Multiple biopsies were obtained to rule out Barrett's esophagus there was no significant hiatal hernia Stomach: Body of the stomach and rugal folds were unremarkable. There were multiple erosions in the gastric antrum and a small 3 mm shallow ulceration at the prepyloric antrum. There was no signs of bleeding. Pyloric outlet was normal. Retroflexion of the endoscope revealed normal fundus and cardia. Biopsies were taken from the gastric antrum to rule out H. pylori  Duodenum: Duodenal bulb and descending duodenum were unremarkable[         The scope was then withdrawn from the patient and the procedure completed.  COMPLICATIONS: There were no complications. ENDOSCOPIC IMPRESSION: [1. grade 2 esophagitis status post biopsies 2. antral gastritis with erosions  and small superficial ulceration status post biopsies to rule out H. pyloriI RECOMMENDATIONS: 1.  Await biopsy results 2.   increase omeprazole to 40 mg a day she is currently on 20 mg a day.  REPEAT EXAM: for EGD pending biopsy results.  eSigned:  Hart Carwin, MD 03/31/2013 10:40 AM   CC:  PATIENT NAME:  Jamie, Jensen MR#: [id number]

## 2013-04-05 ENCOUNTER — Telehealth: Payer: Self-pay | Admitting: *Deleted

## 2013-04-05 NOTE — Telephone Encounter (Signed)
No answer, left message to call if questions or concerns. 

## 2013-04-06 ENCOUNTER — Encounter: Payer: Self-pay | Admitting: Internal Medicine

## 2013-04-19 ENCOUNTER — Other Ambulatory Visit: Payer: Self-pay | Admitting: Internal Medicine

## 2013-04-21 DIAGNOSIS — E049 Nontoxic goiter, unspecified: Secondary | ICD-10-CM | POA: Diagnosis not present

## 2013-04-22 ENCOUNTER — Other Ambulatory Visit: Payer: Self-pay | Admitting: Endocrinology

## 2013-04-22 DIAGNOSIS — E049 Nontoxic goiter, unspecified: Secondary | ICD-10-CM

## 2013-04-26 ENCOUNTER — Ambulatory Visit
Admission: RE | Admit: 2013-04-26 | Discharge: 2013-04-26 | Disposition: A | Discharge status: Home / Self Care | Payer: Medicare Other | Source: Ambulatory Visit | Attending: Endocrinology | Admitting: Endocrinology

## 2013-04-26 DIAGNOSIS — E042 Nontoxic multinodular goiter: Secondary | ICD-10-CM | POA: Diagnosis not present

## 2013-04-26 DIAGNOSIS — E049 Nontoxic goiter, unspecified: Secondary | ICD-10-CM

## 2013-05-12 DIAGNOSIS — Z130 Encounter for screening for diseases of the blood and blood-forming organs and certain disorders involving the immune mechanism: Secondary | ICD-10-CM | POA: Diagnosis not present

## 2013-05-12 DIAGNOSIS — Z1231 Encounter for screening mammogram for malignant neoplasm of breast: Secondary | ICD-10-CM | POA: Diagnosis not present

## 2013-05-12 DIAGNOSIS — Z124 Encounter for screening for malignant neoplasm of cervix: Secondary | ICD-10-CM | POA: Diagnosis not present

## 2013-05-22 ENCOUNTER — Other Ambulatory Visit: Payer: Self-pay | Admitting: Internal Medicine

## 2013-05-24 ENCOUNTER — Other Ambulatory Visit: Payer: Self-pay | Admitting: *Deleted

## 2013-05-24 MED ORDER — OMEPRAZOLE 40 MG PO CPDR: 40.0000 mg | DELAYED_RELEASE_CAPSULE | Freq: Every day | ORAL | Status: DC

## 2013-06-09 DIAGNOSIS — K259 Gastric ulcer, unspecified as acute or chronic, without hemorrhage or perforation: Secondary | ICD-10-CM | POA: Diagnosis not present

## 2013-06-09 DIAGNOSIS — J019 Acute sinusitis, unspecified: Secondary | ICD-10-CM | POA: Diagnosis not present

## 2013-06-16 DIAGNOSIS — J343 Hypertrophy of nasal turbinates: Secondary | ICD-10-CM | POA: Diagnosis not present

## 2013-06-16 DIAGNOSIS — J3501 Chronic tonsillitis: Secondary | ICD-10-CM | POA: Diagnosis not present

## 2013-06-16 DIAGNOSIS — J310 Chronic rhinitis: Secondary | ICD-10-CM | POA: Diagnosis not present

## 2013-06-16 DIAGNOSIS — J342 Deviated nasal septum: Secondary | ICD-10-CM | POA: Diagnosis not present

## 2013-07-26 DIAGNOSIS — M810 Age-related osteoporosis without current pathological fracture: Secondary | ICD-10-CM | POA: Diagnosis not present

## 2013-07-26 DIAGNOSIS — K219 Gastro-esophageal reflux disease without esophagitis: Secondary | ICD-10-CM | POA: Diagnosis not present

## 2013-07-26 DIAGNOSIS — I10 Essential (primary) hypertension: Secondary | ICD-10-CM | POA: Diagnosis not present

## 2013-07-30 DIAGNOSIS — I10 Essential (primary) hypertension: Secondary | ICD-10-CM | POA: Diagnosis not present

## 2013-07-30 DIAGNOSIS — M810 Age-related osteoporosis without current pathological fracture: Secondary | ICD-10-CM | POA: Diagnosis not present

## 2013-07-30 DIAGNOSIS — Z0000 Encounter for general adult medical examination without abnormal findings: Secondary | ICD-10-CM | POA: Diagnosis not present

## 2013-07-30 DIAGNOSIS — D239 Other benign neoplasm of skin, unspecified: Secondary | ICD-10-CM | POA: Diagnosis not present

## 2013-07-30 DIAGNOSIS — R0681 Apnea, not elsewhere classified: Secondary | ICD-10-CM | POA: Diagnosis not present

## 2013-08-02 DIAGNOSIS — J3501 Chronic tonsillitis: Secondary | ICD-10-CM | POA: Diagnosis not present

## 2013-08-03 ENCOUNTER — Other Ambulatory Visit: Payer: Self-pay | Admitting: Otolaryngology

## 2013-08-03 ENCOUNTER — Encounter (HOSPITAL_BASED_OUTPATIENT_CLINIC_OR_DEPARTMENT_OTHER): Payer: Self-pay | Admitting: *Deleted

## 2013-08-03 NOTE — Progress Notes (Signed)
Pt has chronic sore throat and infections-does snore-has never had a sleep study-had recent labs and ekg dr pharr-called for notes

## 2013-08-09 ENCOUNTER — Ambulatory Visit (HOSPITAL_BASED_OUTPATIENT_CLINIC_OR_DEPARTMENT_OTHER)
Admission: RE | Admit: 2013-08-09 | Discharge: 2013-08-09 | Disposition: A | Discharge status: Home / Self Care | Payer: Medicare Other | Source: Ambulatory Visit | Attending: Otolaryngology | Admitting: Otolaryngology

## 2013-08-09 ENCOUNTER — Ambulatory Visit (HOSPITAL_BASED_OUTPATIENT_CLINIC_OR_DEPARTMENT_OTHER): Payer: Medicare Other | Admitting: Anesthesiology

## 2013-08-09 ENCOUNTER — Encounter (HOSPITAL_BASED_OUTPATIENT_CLINIC_OR_DEPARTMENT_OTHER): Payer: Self-pay | Admitting: *Deleted

## 2013-08-09 ENCOUNTER — Encounter (HOSPITAL_BASED_OUTPATIENT_CLINIC_OR_DEPARTMENT_OTHER): Payer: Medicare Other | Admitting: Anesthesiology

## 2013-08-09 ENCOUNTER — Encounter (HOSPITAL_BASED_OUTPATIENT_CLINIC_OR_DEPARTMENT_OTHER)
Admission: RE | Disposition: A | Discharge status: Home / Self Care | Payer: Self-pay | Source: Ambulatory Visit | Attending: Otolaryngology

## 2013-08-09 DIAGNOSIS — J351 Hypertrophy of tonsils: Secondary | ICD-10-CM | POA: Diagnosis not present

## 2013-08-09 DIAGNOSIS — Z9089 Acquired absence of other organs: Secondary | ICD-10-CM

## 2013-08-09 DIAGNOSIS — J353 Hypertrophy of tonsils with hypertrophy of adenoids: Secondary | ICD-10-CM | POA: Diagnosis not present

## 2013-08-09 DIAGNOSIS — K219 Gastro-esophageal reflux disease without esophagitis: Secondary | ICD-10-CM | POA: Insufficient documentation

## 2013-08-09 DIAGNOSIS — J3501 Chronic tonsillitis: Secondary | ICD-10-CM | POA: Diagnosis not present

## 2013-08-09 DIAGNOSIS — I10 Essential (primary) hypertension: Secondary | ICD-10-CM | POA: Diagnosis not present

## 2013-08-09 HISTORY — DX: Chronic pharyngitis: J31.2

## 2013-08-09 HISTORY — DX: Snoring: R06.83

## 2013-08-09 HISTORY — PX: TONSILLECTOMY AND ADENOIDECTOMY: SHX28

## 2013-08-09 LAB — POCT HEMOGLOBIN-HEMACUE: Hemoglobin: 15.7 g/dL — ABNORMAL HIGH (ref 12.0–15.0)

## 2013-08-09 SURGERY — TONSILLECTOMY AND ADENOIDECTOMY
Anesthesia: General | Laterality: Bilateral

## 2013-08-09 MED ORDER — MIDAZOLAM HCL 2 MG/2ML IJ SOLN
INTRAMUSCULAR | Status: AC
  Filled 2013-08-09: qty 2

## 2013-08-09 MED ORDER — MIDAZOLAM HCL 2 MG/2ML IJ SOLN: 1.0000 mg | INTRAMUSCULAR | Status: DC | PRN

## 2013-08-09 MED ORDER — PROPOFOL 10 MG/ML IV BOLUS
INTRAVENOUS | Status: DC | PRN
  Administered 2013-08-09: 200 mg via INTRAVENOUS

## 2013-08-09 MED ORDER — MIDAZOLAM HCL 5 MG/5ML IJ SOLN
INTRAMUSCULAR | Status: DC | PRN
  Administered 2013-08-09: 2 mg via INTRAVENOUS

## 2013-08-09 MED ORDER — MEPERIDINE HCL 25 MG/ML IJ SOLN: 6.2500 mg | INTRAMUSCULAR | Status: DC | PRN

## 2013-08-09 MED ORDER — HYDROMORPHONE HCL PF 1 MG/ML IJ SOLN: 0.2500 mg | INTRAMUSCULAR | Status: DC | PRN

## 2013-08-09 MED ORDER — LIDOCAINE HCL (CARDIAC) 20 MG/ML IV SOLN
INTRAVENOUS | Status: DC | PRN
  Administered 2013-08-09: 100 mg via INTRAVENOUS

## 2013-08-09 MED ORDER — SODIUM CHLORIDE 0.9 % IR SOLN
Status: DC | PRN
  Administered 2013-08-09: 1

## 2013-08-09 MED ORDER — LACTATED RINGERS IV SOLN
INTRAVENOUS | Status: DC
  Administered 2013-08-09 (×2): via INTRAVENOUS

## 2013-08-09 MED ORDER — FENTANYL CITRATE 0.05 MG/ML IJ SOLN: 50.0000 ug | INTRAMUSCULAR | Status: DC | PRN

## 2013-08-09 MED ORDER — AMOXICILLIN 400 MG/5ML PO SUSR
800.0000 mg | Freq: Two times a day (BID) | ORAL | Status: AC
Start: 2013-08-09 — End: 2013-08-13

## 2013-08-09 MED ORDER — SCOPOLAMINE 1 MG/3DAYS TD PT72
MEDICATED_PATCH | TRANSDERMAL | Status: AC
  Filled 2013-08-09: qty 1

## 2013-08-09 MED ORDER — OXYCODONE HCL 5 MG/5ML PO SOLN: 5.0000 mg | ORAL | Status: DC | PRN

## 2013-08-09 MED ORDER — SCOPOLAMINE 1 MG/3DAYS TD PT72
1.0000 | MEDICATED_PATCH | TRANSDERMAL | Status: DC
  Administered 2013-08-09: 1.5 mg via TRANSDERMAL

## 2013-08-09 MED ORDER — FENTANYL CITRATE 0.05 MG/ML IJ SOLN
INTRAMUSCULAR | Status: AC
  Filled 2013-08-09: qty 4

## 2013-08-09 MED ORDER — ONDANSETRON HCL 4 MG/2ML IJ SOLN
INTRAMUSCULAR | Status: DC | PRN
  Administered 2013-08-09: 4 mg via INTRAVENOUS

## 2013-08-09 MED ORDER — PROMETHAZINE HCL 25 MG/ML IJ SOLN: 6.2500 mg | INTRAMUSCULAR | Status: DC | PRN

## 2013-08-09 MED ORDER — BACITRACIN 500 UNIT/GM EX OINT
TOPICAL_OINTMENT | CUTANEOUS | Status: DC | PRN
  Administered 2013-08-09: 1 via TOPICAL

## 2013-08-09 MED ORDER — SUCCINYLCHOLINE CHLORIDE 20 MG/ML IJ SOLN
INTRAMUSCULAR | Status: DC | PRN
  Administered 2013-08-09: 100 mg via INTRAVENOUS

## 2013-08-09 MED ORDER — OXYMETAZOLINE HCL 0.05 % NA SOLN
NASAL | Status: DC | PRN
  Administered 2013-08-09: 1

## 2013-08-09 MED ORDER — OXYCODONE HCL 5 MG PO TABS: 5.0000 mg | ORAL_TABLET | Freq: Once | ORAL | Status: DC | PRN

## 2013-08-09 MED ORDER — DEXAMETHASONE SODIUM PHOSPHATE 4 MG/ML IJ SOLN
INTRAMUSCULAR | Status: DC | PRN
  Administered 2013-08-09: 10 mg via INTRAVENOUS

## 2013-08-09 MED ORDER — FENTANYL CITRATE 0.05 MG/ML IJ SOLN
INTRAMUSCULAR | Status: DC | PRN
Start: 2013-08-09 — End: 2013-08-09
  Administered 2013-08-09: 100 ug via INTRAVENOUS

## 2013-08-09 MED ORDER — OXYCODONE HCL 5 MG/5ML PO SOLN: 5.0000 mg | Freq: Once | ORAL | Status: DC | PRN

## 2013-08-09 SURGICAL SUPPLY — 29 items
BANDAGE COBAN STERILE 2 (GAUZE/BANDAGES/DRESSINGS) IMPLANT
CANISTER SUCT 1200ML W/VALVE (MISCELLANEOUS) ×2 IMPLANT
CATH ROBINSON RED A/P 10FR (CATHETERS) IMPLANT
CATH ROBINSON RED A/P 14FR (CATHETERS) ×2 IMPLANT
COAGULATOR SUCT SWTCH 10FR 6 (ELECTROSURGICAL) IMPLANT
COVER MAYO STAND STRL (DRAPES) ×2 IMPLANT
ELECT REM PT RETURN 9FT ADLT (ELECTROSURGICAL) ×2
ELECT REM PT RETURN 9FT PED (ELECTROSURGICAL)
ELECTRODE REM PT RETRN 9FT PED (ELECTROSURGICAL) IMPLANT
ELECTRODE REM PT RTRN 9FT ADLT (ELECTROSURGICAL) ×1 IMPLANT
GLOVE BIO SURGEON STRL SZ7.5 (GLOVE) ×2 IMPLANT
GLOVE SURG SS PI 7.0 STRL IVOR (GLOVE) ×2 IMPLANT
GOWN STRL REUS W/ TWL LRG LVL3 (GOWN DISPOSABLE) ×2 IMPLANT
GOWN STRL REUS W/TWL LRG LVL3 (GOWN DISPOSABLE) ×2
IV NS 500ML (IV SOLUTION) ×1
IV NS 500ML BAXH (IV SOLUTION) ×1 IMPLANT
MARKER SKIN DUAL TIP RULER LAB (MISCELLANEOUS) IMPLANT
NS IRRIG 1000ML POUR BTL (IV SOLUTION) ×2 IMPLANT
SHEET MEDIUM DRAPE 40X70 STRL (DRAPES) ×2 IMPLANT
SOLUTION BUTLER CLEAR DIP (MISCELLANEOUS) ×2 IMPLANT
SPONGE GAUZE 4X4 12PLY STER LF (GAUZE/BANDAGES/DRESSINGS) ×2 IMPLANT
SPONGE TONSIL 1 RF SGL (DISPOSABLE) IMPLANT
SPONGE TONSIL 1.25 RF SGL STRG (GAUZE/BANDAGES/DRESSINGS) ×2 IMPLANT
SYR BULB 3OZ (MISCELLANEOUS) IMPLANT
TOWEL OR 17X24 6PK STRL BLUE (TOWEL DISPOSABLE) ×2 IMPLANT
TUBE CONNECTING 20X1/4 (TUBING) ×2 IMPLANT
TUBE SALEM SUMP 12R W/ARV (TUBING) IMPLANT
TUBE SALEM SUMP 16 FR W/ARV (TUBING) ×2 IMPLANT
WAND COBLATOR 70 EVAC XTRA (SURGICAL WAND) ×2 IMPLANT

## 2013-08-09 NOTE — Op Note (Signed)
DATE OF PROCEDURE:  08/09/2013                              OPERATIVE REPORT  SURGEON:  Leta Baptist, MD  PREOPERATIVE DIAGNOSES: 1. Tonsillar hypertrophy. 2. Chronic tonsillitis.  POSTOPERATIVE DIAGNOSES: 1. Tonsillar hypertrophy. 2. Chronic tonsillitis.  PROCEDURE PERFORMED:  Tonsillectomy.  ANESTHESIA:  General endotracheal tube anesthesia.  COMPLICATIONS:  None.  ESTIMATED BLOOD LOSS:  Minimal.  INDICATION FOR PROCEDURE:  Jamie Jensen is a 68 y.o. female with a history of chronic tonsillitis/pharyngitis and halitosis.  According to the patient, she has been experiencing frequent tonsillolith accumulation. The patient continues to be symptomatic despite medical treatments. On examination, the patient was noted to have bilateral cryptic tonsils, with numerous tonsilloliths. Based on the above findings, the decision was made for the patient to undergo the tonsillectomy procedure. Likelihood of success in reducing symptoms was also discussed.  The risks, benefits, alternatives, and details of the procedure were discussed with the mother.  Questions were invited and answered.  Informed consent was obtained.  DESCRIPTION:  The patient was taken to the operating room and placed supine on the operating table.  General endotracheal tube anesthesia was administered by the anesthesiologist.  The patient was positioned and prepped and draped in a standard fashion for adenotonsillectomy.  A Crowe-Davis mouth gag was inserted into the oral cavity for exposure. 1+ cryptic tonsils were noted bilaterally.  No bifidity was noted.  Indirect mirror examination of the nasopharynx revealed no adenoid hypertrophy.  The right tonsil was then grasped with a straight Allis clamp and retracted medially.  It was resected free from the underlying pharyngeal constrictor muscles with the Coblator device.  The same procedure was repeated on the left side without exception.  The surgical sites were copiously irrigated.  The  mouth gag was removed.  The care of the patient was turned over to the anesthesiologist.  The patient was awakened from anesthesia without difficulty.  The patient was extubated and transferred to the recovery room in good condition.  OPERATIVE FINDINGS:  Tonsillar hypertrophy.  SPECIMEN:  Bilateral tonsils  FOLLOWUP CARE:  The patient will be discharged home once awake and alert.  She will be placed on amoxicillin 800 mg p.o. b.i.d. for 5 days, and oxycodone 5-62ml po q 4 hours for postop pain control.   The patient will follow up in my office in approximately 2 weeks.  Ascencion Dike 08/09/2013 10:44 AM

## 2013-08-09 NOTE — Anesthesia Preprocedure Evaluation (Addendum)
Anesthesia Evaluation  Patient identified by MRN, date of birth, ID band Patient awake    Reviewed: Allergy & Precautions, H&P , NPO status , Patient's Chart, lab work & pertinent test results  History of Anesthesia Complications Negative for: history of anesthetic complications  Airway Mallampati: II TM Distance: >3 FB Neck ROM: Full    Dental  (+) Dental Advisory Given   Pulmonary neg pulmonary ROS,  breath sounds clear to auscultation  Pulmonary exam normal       Cardiovascular hypertension, Pt. on medications Rhythm:Regular Rate:Normal  '08 ECHO: normal LVF, EF 60%, valves OK   Neuro/Psych negative neurological ROS     GI/Hepatic Neg liver ROS, GERD-  Medicated and Controlled,  Endo/Other  Morbid obesity  Renal/GU negative Renal ROS     Musculoskeletal   Abdominal (+) + obese,   Peds  Hematology   Anesthesia Other Findings   Reproductive/Obstetrics                         Anesthesia Physical Anesthesia Plan  ASA: II  Anesthesia Plan: General   Post-op Pain Management:    Induction: Intravenous  Airway Management Planned: Oral ETT  Additional Equipment:   Intra-op Plan:   Post-operative Plan: Extubation in OR  Informed Consent: I have reviewed the patients History and Physical, chart, labs and discussed the procedure including the risks, benefits and alternatives for the proposed anesthesia with the patient or authorized representative who has indicated his/her understanding and acceptance.   Dental advisory given  Plan Discussed with: CRNA and Surgeon  Anesthesia Plan Comments: (Plan routine monitors, GETA)        Anesthesia Quick Evaluation

## 2013-08-09 NOTE — H&P (Signed)
  H&P Update  Pt's original H&P dated 08/02/13 reviewed and placed in chart (to be scanned).  I personally examined the patient today.  No change in health. Proceed with tonsillectomy, possible adenoidectomy.

## 2013-08-09 NOTE — Transfer of Care (Signed)
Immediate Anesthesia Transfer of Care Note  Patient: Jamie Jensen  Procedure(s) Performed: Procedure(s): BILATERAL TONSILLECTOMY AND ADENOIDECTOMY (Bilateral)  Patient Location: PACU  Anesthesia Type:General  Level of Consciousness: awake  Airway & Oxygen Therapy: Patient Spontanous Breathing and Patient connected to face mask oxygen  Post-op Assessment: Report given to PACU RN and Post -op Vital signs reviewed and stable  Post vital signs: Reviewed and stable  Complications: No apparent anesthesia complications

## 2013-08-09 NOTE — Anesthesia Procedure Notes (Signed)
Procedure Name: Intubation Date/Time: 08/09/2013 10:15 AM Performed by: Lieutenant Diego Pre-anesthesia Checklist: Patient identified, Emergency Drugs available, Suction available and Patient being monitored Patient Re-evaluated:Patient Re-evaluated prior to inductionOxygen Delivery Method: Circle System Utilized Preoxygenation: Pre-oxygenation with 100% oxygen Intubation Type: IV induction Ventilation: Mask ventilation without difficulty Laryngoscope Size: Miller and 2 Grade View: Grade II Tube type: Oral Tube size: 7.0 mm Number of attempts: 1 Airway Equipment and Method: stylet and oral airway Placement Confirmation: ETT inserted through vocal cords under direct vision,  positive ETCO2 and breath sounds checked- equal and bilateral Secured at: 21 cm Tube secured with: Tape Dental Injury: Teeth and Oropharynx as per pre-operative assessment

## 2013-08-09 NOTE — Anesthesia Postprocedure Evaluation (Signed)
  Anesthesia Post-op Note  Patient: Jamie Jensen  Procedure(s) Performed: Procedure(s): BILATERAL TONSILLECTOMY  (Bilateral)  Patient Location: PACU  Anesthesia Type:General  Level of Consciousness: awake, alert , oriented and patient cooperative  Airway and Oxygen Therapy: Patient Spontanous Breathing  Post-op Pain: none  Post-op Assessment: Post-op Vital signs reviewed, Patient's Cardiovascular Status Stable, Respiratory Function Stable, Patent Airway, No signs of Nausea or vomiting and Pain level controlled  Post-op Vital Signs: Reviewed and stable  Complications: No apparent anesthesia complications

## 2013-08-09 NOTE — Discharge Instructions (Addendum)
[facility] °Postoperative Instructions for Tonsillectomy  °Activity °Restrict activity at home for the first two days, resting as much as possible. Light indoor activity is best. You may usually return to school or work within a week but void strenuous activity and sports for two weeks. Sleep with your head elevated on 2-3 pillows for 3-4 days to help decrease swelling. °Diet °Due to tissue swelling and throat discomfort, you may have little desire to drink for several days. However fluids are very important to prevent dehydration. You will find that non-acidic juices, soups, popsicles, Jell-O, custard, puddings, and any soft or mashed foods taken in small quantities can be swallowed fairly easily. Try to increase your fluid and food intake as the discomfort subsides. It is recommended that a child receive 1-1/2 quarts of fluid in a 24-hour period. Adult require twice this amount.  °Discomfort °Your sore throat may be relieved by applying an ice collar to your neck and/or by taking Tylenol®. You may experience an earache, which is due to referred pain from the throat. Referred ear pain is commonly felt at night when trying to rest. ° °Bleeding                        Although rare, there is risk of having some bleeding during the first 2 weeks after having a T&A. This usually happens between days 7-10 postoperatively. If you or your child should have any bleeding, try to remain calm. We recommend sitting up quietly in a chair and gently spitting out the blood into a bowl. For adults, gargling gently with ice water may help. If the bleeding does not stop after a short time (5 minutes), is more than 1 teaspoonful, or if you become worried, please call our office at [phone] or go directly to the nearest hospital emergency room. Do not eat or drink anything prior to going to the hospital as you may need to be taken to the operating room in order to control the bleeding. °GENERAL  CONSIDERATIONS °1. Brush your teeth regularly. Avoid mouthwashes and gargles for three weeks. You may gargle gently with warm salt-water as necessary or spray with Chloraseptic®. You may make salt-water by placing 2 teaspoons of table salt into a quart of fresh water. Warm the salt-water in a microwave to a luke warm temperature.  °2. Avoid exposure to colds and upper respiratory infections if possible.  °3. If you look into a mirror or into your child's mouth, you will see white-gray patches in the back of the throat. This is normal after having a T&A and is like a scab that forms on the skin after an abrasion. It will disappear once the back of the throat heals completely. However, it may cause a noticeable odor; this too will disappear with time. Again, warm salt-water gargles may be used to help keep the throat clean and promote healing.  °4. You may notice a temporary change in voice quality, such as a higher pitched voice or a nasal sound, until healing is complete. This may last for 1-2 weeks and should resolve.  °5. Do not take or give you child any medications that we have not prescribed or recommended.  °6. Snoring may occur, especially at night, for the first week after a T&A. It is due to swelling of the soft palate and will usually resolve.  °Please call our office at [phone] if you have any questions.   ° ° ° ° ° °  Anesthesia Home Care Instructions  Activity: Get plenty of rest for the remainder of the day. A responsible adult should stay with you for 24 hours following the procedure.  For the next 24 hours, DO NOT: -Drive a car -Paediatric nurse -Drink alcoholic beverages -Take any medication unless instructed by your physician -Make any legal decisions or sign important papers.  Meals: Start with liquid foods such as gelatin or soup. Progress to regular foods as tolerated. Avoid greasy, spicy, heavy foods. If nausea and/or vomiting occur, drink only clear liquids until the nausea  and/or vomiting subsides. Call your physician if vomiting continues.  Special Instructions/Symptoms: Your throat may feel dry or sore from the anesthesia or the breathing tube placed in your throat during surgery. If this causes discomfort, gargle with warm salt water. The discomfort should disappear within 24 hours.

## 2013-08-10 ENCOUNTER — Encounter (HOSPITAL_BASED_OUTPATIENT_CLINIC_OR_DEPARTMENT_OTHER): Payer: Self-pay | Admitting: Otolaryngology

## 2013-11-17 ENCOUNTER — Other Ambulatory Visit: Payer: Self-pay | Admitting: Internal Medicine

## 2013-12-02 ENCOUNTER — Other Ambulatory Visit: Payer: Self-pay | Admitting: Endocrinology

## 2013-12-02 DIAGNOSIS — E049 Nontoxic goiter, unspecified: Secondary | ICD-10-CM

## 2013-12-20 ENCOUNTER — Ambulatory Visit
Admission: RE | Admit: 2013-12-20 | Discharge: 2013-12-20 | Disposition: A | Discharge status: Home / Self Care | Payer: Medicare Other | Source: Ambulatory Visit | Attending: Endocrinology | Admitting: Endocrinology

## 2013-12-20 DIAGNOSIS — E041 Nontoxic single thyroid nodule: Secondary | ICD-10-CM | POA: Diagnosis not present

## 2013-12-20 DIAGNOSIS — E049 Nontoxic goiter, unspecified: Secondary | ICD-10-CM

## 2013-12-20 DIAGNOSIS — E042 Nontoxic multinodular goiter: Secondary | ICD-10-CM | POA: Diagnosis not present

## 2014-02-21 ENCOUNTER — Telehealth: Payer: Self-pay | Admitting: Internal Medicine

## 2014-02-21 NOTE — Telephone Encounter (Signed)
Spoke with patient and discussed OV here or with PCP to renew PPI. She will ask her PCP.

## 2014-02-28 DIAGNOSIS — K439 Ventral hernia without obstruction or gangrene: Secondary | ICD-10-CM | POA: Diagnosis not present

## 2014-02-28 DIAGNOSIS — K219 Gastro-esophageal reflux disease without esophagitis: Secondary | ICD-10-CM | POA: Diagnosis not present

## 2014-03-24 DIAGNOSIS — H5201 Hypermetropia, right eye: Secondary | ICD-10-CM | POA: Diagnosis not present

## 2014-03-24 DIAGNOSIS — H5212 Myopia, left eye: Secondary | ICD-10-CM | POA: Diagnosis not present

## 2014-03-24 DIAGNOSIS — H04129 Dry eye syndrome of unspecified lacrimal gland: Secondary | ICD-10-CM | POA: Diagnosis not present

## 2014-03-24 DIAGNOSIS — H10509 Unspecified blepharoconjunctivitis, unspecified eye: Secondary | ICD-10-CM | POA: Diagnosis not present

## 2014-03-24 DIAGNOSIS — H52221 Regular astigmatism, right eye: Secondary | ICD-10-CM | POA: Diagnosis not present

## 2014-04-06 ENCOUNTER — Other Ambulatory Visit (INDEPENDENT_AMBULATORY_CARE_PROVIDER_SITE_OTHER): Payer: Self-pay | Admitting: General Surgery

## 2014-04-06 ENCOUNTER — Other Ambulatory Visit (INDEPENDENT_AMBULATORY_CARE_PROVIDER_SITE_OTHER): Payer: Self-pay | Admitting: *Deleted

## 2014-04-06 DIAGNOSIS — K432 Incisional hernia without obstruction or gangrene: Secondary | ICD-10-CM

## 2014-04-06 DIAGNOSIS — R1011 Right upper quadrant pain: Secondary | ICD-10-CM

## 2014-04-11 ENCOUNTER — Ambulatory Visit
Admission: RE | Admit: 2014-04-11 | Discharge: 2014-04-11 | Disposition: A | Discharge status: Home / Self Care | Payer: Medicare Other | Source: Ambulatory Visit | Attending: General Surgery | Admitting: General Surgery

## 2014-04-11 DIAGNOSIS — R109 Unspecified abdominal pain: Secondary | ICD-10-CM | POA: Diagnosis not present

## 2014-04-11 DIAGNOSIS — R1011 Right upper quadrant pain: Secondary | ICD-10-CM

## 2014-04-11 DIAGNOSIS — D3501 Benign neoplasm of right adrenal gland: Secondary | ICD-10-CM | POA: Diagnosis not present

## 2014-04-11 DIAGNOSIS — Z9049 Acquired absence of other specified parts of digestive tract: Secondary | ICD-10-CM | POA: Diagnosis not present

## 2014-04-11 DIAGNOSIS — R110 Nausea: Secondary | ICD-10-CM | POA: Diagnosis not present

## 2014-04-11 DIAGNOSIS — K432 Incisional hernia without obstruction or gangrene: Secondary | ICD-10-CM

## 2014-04-11 MED ORDER — IOHEXOL 300 MG/ML  SOLN
100.0000 mL | Freq: Once | INTRAMUSCULAR | Status: AC | PRN
  Administered 2014-04-11: 100 mL via INTRAVENOUS

## 2014-05-13 DIAGNOSIS — Z23 Encounter for immunization: Secondary | ICD-10-CM | POA: Diagnosis not present

## 2014-05-13 DIAGNOSIS — Z01419 Encounter for gynecological examination (general) (routine) without abnormal findings: Secondary | ICD-10-CM | POA: Diagnosis not present

## 2014-05-13 DIAGNOSIS — Z1231 Encounter for screening mammogram for malignant neoplasm of breast: Secondary | ICD-10-CM | POA: Diagnosis not present

## 2014-06-18 IMAGING — US US SOFT TISSUE HEAD/NECK
1 series · 14 of 25 positions shown · non-contrast
Comparison: 07/24/2012

CLINICAL DATA: Goiter.

EXAM:
THYROID ULTRASOUND
TECHNIQUE: Ultrasound examination of the thyroid gland and adjacent soft
tissues was performed.

[Series 1: us soft tissue head/neck · 0.08mm/px · 14 of 59 slices shown]
[im 1/59]
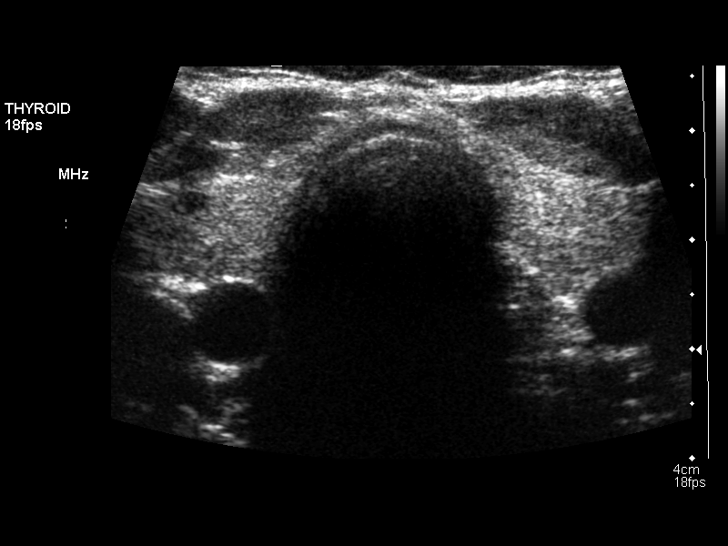
[im 5/59]
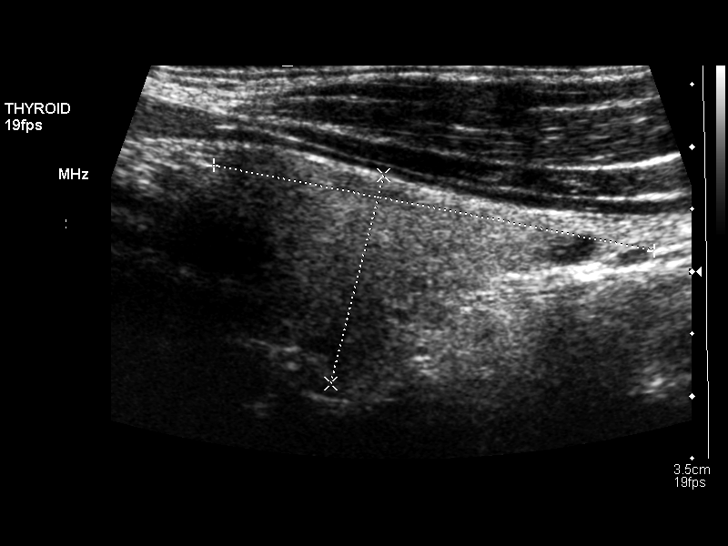
[im 10/59]
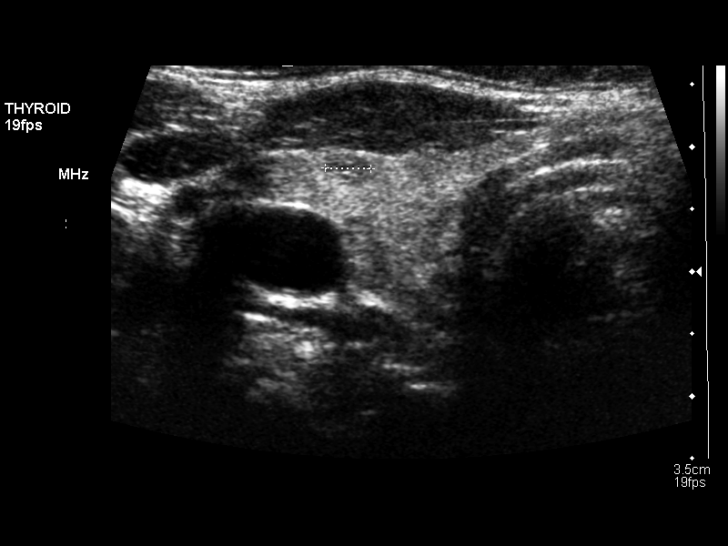
[im 15/59]
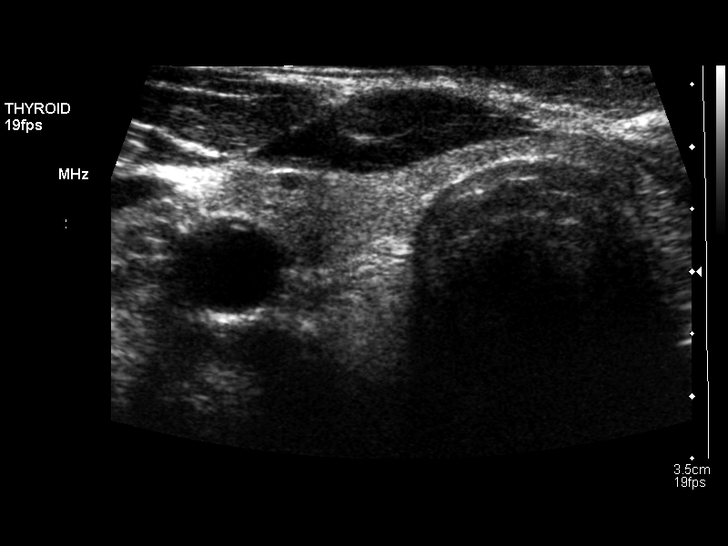
[im 20/59]
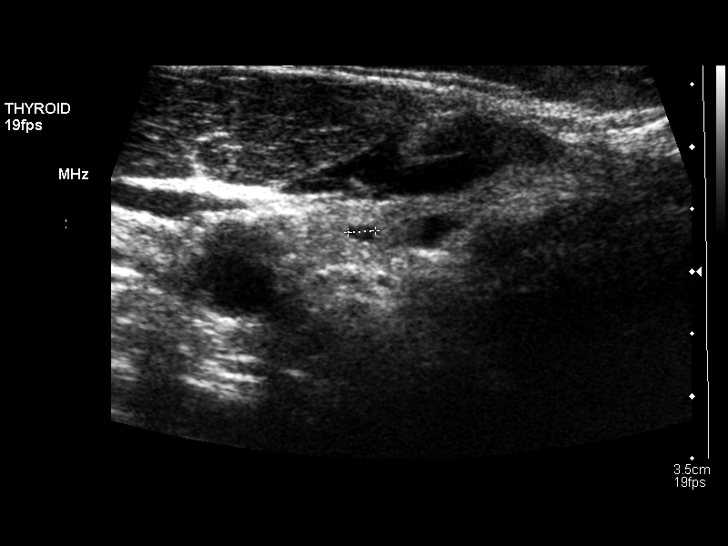
[im 22/59]
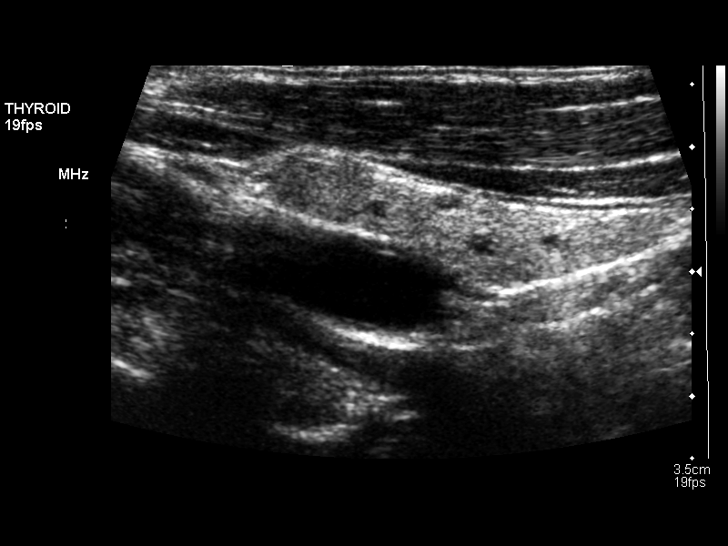
[im 27/59]
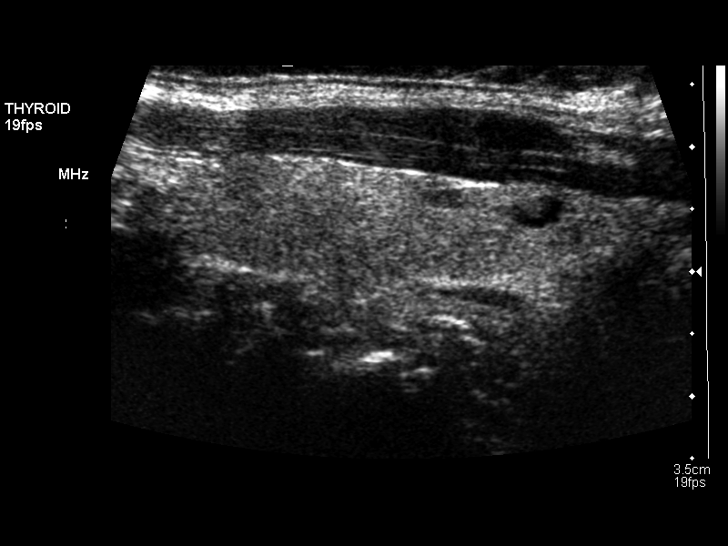
[im 32/59]
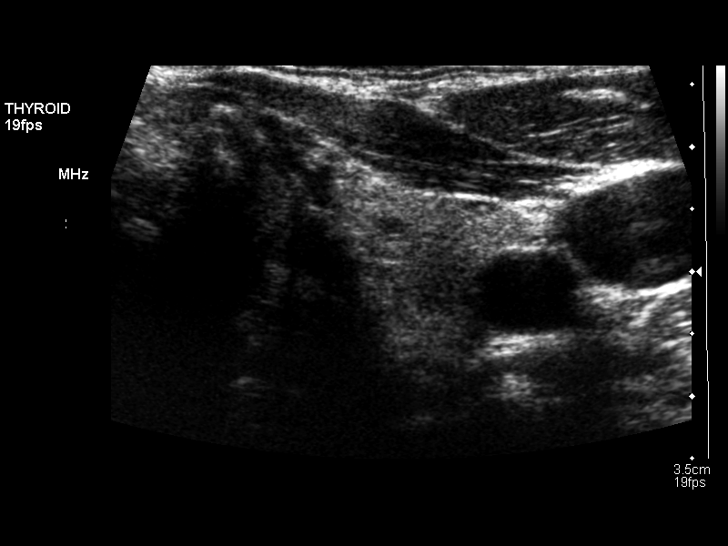
[im 37/59]
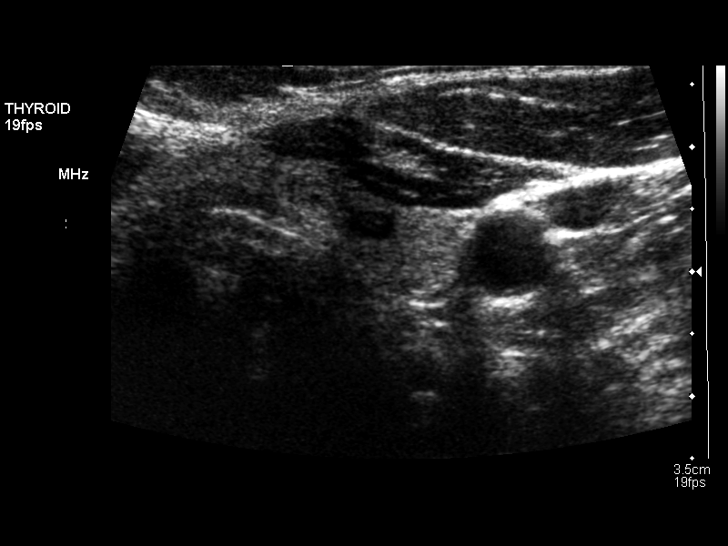
[im 39/59]
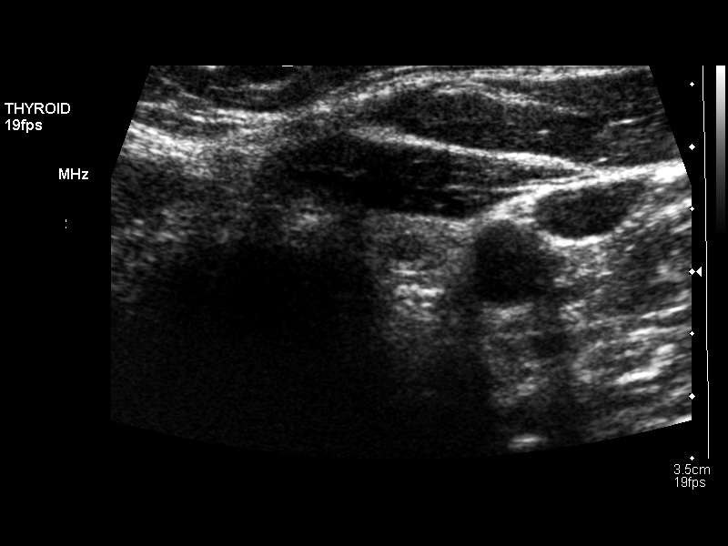
[im 44/59]
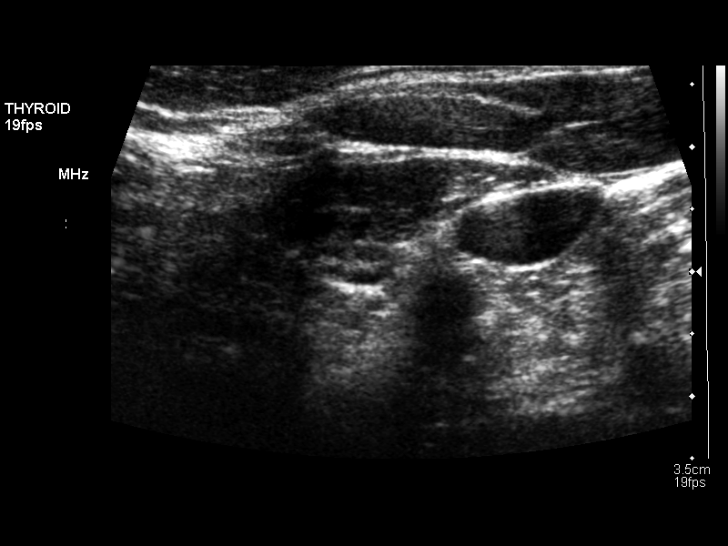
[im 49/59]
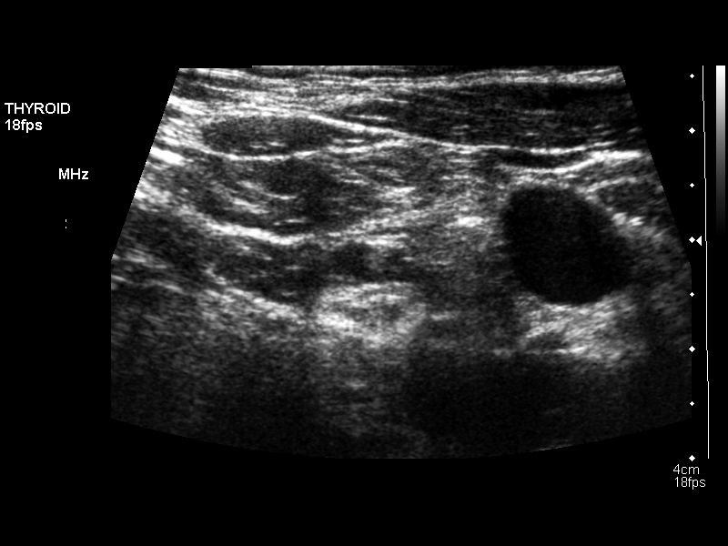
[im 54/59]
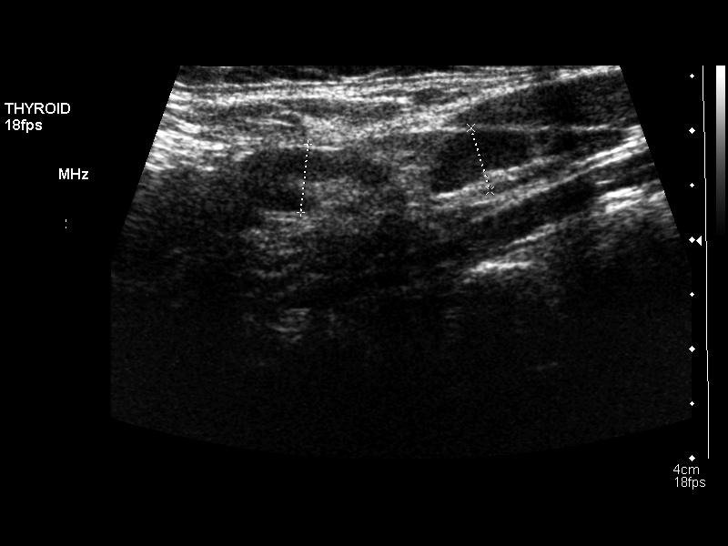
[im 59/59]
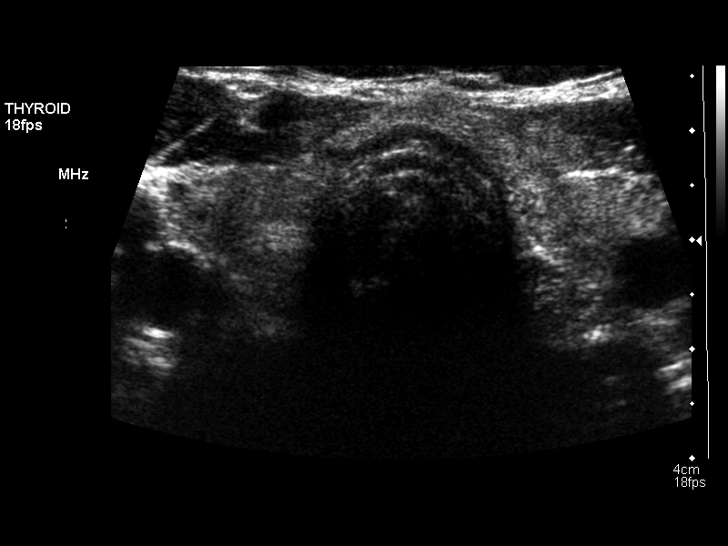

[14 of 25 positions shown; findings below may reference images not displayed]

FINDINGS: Right thyroid lobe

Measurements: 3.6 x 1.7 x 1.5 cm. Multiple small hypoechoic nodules
in the right lobe. No dominant nodule.

Left thyroid lobe

Measurements: 4.0 x 1.1 x 1.5 cm. Multiple small hypoechoic nodules
with no dominant nodule.

Isthmus

Thickness: 0.3 cm.  No nodules visualized.

Lymphadenopathy

None visualized.
IMPRESSION: Multinodular goiter, essentially unchanged since the prior study. No
dominant nodules.

## 2014-08-08 DIAGNOSIS — M79672 Pain in left foot: Secondary | ICD-10-CM | POA: Diagnosis not present

## 2014-08-08 DIAGNOSIS — M722 Plantar fascial fibromatosis: Secondary | ICD-10-CM | POA: Diagnosis not present

## 2014-08-12 ENCOUNTER — Encounter: Payer: Self-pay | Admitting: Internal Medicine

## 2014-09-09 DIAGNOSIS — I10 Essential (primary) hypertension: Secondary | ICD-10-CM | POA: Diagnosis not present

## 2014-09-09 DIAGNOSIS — M810 Age-related osteoporosis without current pathological fracture: Secondary | ICD-10-CM | POA: Diagnosis not present

## 2014-09-09 DIAGNOSIS — Z23 Encounter for immunization: Secondary | ICD-10-CM | POA: Diagnosis not present

## 2014-09-09 DIAGNOSIS — K219 Gastro-esophageal reflux disease without esophagitis: Secondary | ICD-10-CM | POA: Diagnosis not present

## 2014-09-09 DIAGNOSIS — Z0000 Encounter for general adult medical examination without abnormal findings: Secondary | ICD-10-CM | POA: Diagnosis not present

## 2014-09-14 ENCOUNTER — Encounter: Payer: Self-pay | Admitting: Internal Medicine

## 2014-09-15 ENCOUNTER — Encounter: Payer: Self-pay | Admitting: *Deleted

## 2014-09-16 ENCOUNTER — Encounter: Payer: Self-pay | Admitting: Cardiovascular Disease

## 2014-09-16 ENCOUNTER — Ambulatory Visit (INDEPENDENT_AMBULATORY_CARE_PROVIDER_SITE_OTHER): Payer: Medicare Other | Admitting: Cardiovascular Disease

## 2014-09-16 VITALS — BP 136/84 | HR 73 | Ht 61.0 in | Wt 171.8 lb

## 2014-09-16 DIAGNOSIS — R0789 Other chest pain: Secondary | ICD-10-CM | POA: Diagnosis not present

## 2014-09-16 NOTE — Progress Notes (Signed)
Patient ID: Jamie Jensen, female   DOB: 19-Jul-1945, 69 y.o.   MRN: [id number] 69 y.o. patient of Dr Shelia Media  . History of obesity and HTN Over the years has had steriotypical episodes of recumbant resting syncope. No palpitations chest pain. Feels like she is going out and then gets headache and some mild dyspnea. Post event feels heart beating hard and fast. Previous w/u over 5 years ago normal No chestp pain Walks at brisk pace regularly with no issues. Spells are not related to exercise, postural changes.  Has a multinodular gointer No TSH on file [place] with no worrisome nodules  2014 :  Normal stress echo reviewed   ROS: Denies fever, malais, weight loss, blurry vision, decreased visual acuity, cough, sputum, SOB, hemoptysis, pleuritic pain, palpitaitons, heartburn, abdominal pain, melena, lower extremity edema, claudication, or rash. All other systems reviewed and negative  May 6th 16  LDL 107   Normal LFTls   TSH normal  Husbands health poor has trigeminal neuralgia and depression  Dr Shelia Media worried about her ECG  I reviewed multiple ECGls and she has chronic LAD and poor R wave progression read as septal infarct Which is just from lead placement and body habitus   ROS: Denies fever, malais, weight loss, blurry vision, decreased visual acuity, cough, sputum, SOB, hemoptysis, pleuritic pain, palpitaitons, heartburn, abdominal pain, melena, lower extremity edema, claudication, or rash.  All other systems reviewed and negative  General: Affect appropriate Healthy:  appears stated age 7: normal Neck supple with no adenopathy JVP normal no bruits no thyromegaly Lungs clear with no wheezing and good diaphragmatic motion Heart:  S1/S2 no murmur, no rub, gallop or click PMI normal Abdomen: benighn, BS positve, no tenderness, no AAA no bruit.  No HSM or HJR Distal pulses intact with no bruits No edema Neuro non-focal Skin warm and dry No muscular weakness   Current Outpatient  Prescriptions  Medication Sig Dispense Refill  . cholecalciferol (VITAMIN D) 1000 UNITS tablet Take 1,000 Units by mouth daily.    Marland Kitchen omeprazole (PRILOSEC) 40 MG capsule TAKE ONE CAPSULE BY MOUTH DAILY 90 capsule 1  . triamterene-hydrochlorothiazide (DYAZIDE) 37.5-25 MG per capsule Take 1 capsule by mouth every morning.     No current facility-administered medications for this visit.    Allergies  Review of patient's allergies indicates no known allergies.  Electrocardiogram:  SR rate 75  Normal ECG 07/30/13  NSR normal no change   Assessment and Plan Chest Pain:  Resolved previously normal stress echo stable GERD:  On prilosec stable low carb diet Goiter:  Seeing Dr Soyla Murphy TSH normal f/u US HTN:  Low sodium DASH diet continue diuretic ECG:  Stable no changes LAD poor R wave progression

## 2014-09-16 NOTE — Patient Instructions (Signed)
Medication Instructions:  NO CHANGES  Labwork: NONE  Testing/Procedures: NONE  Follow-Up: Your physician wants you to follow-up in: YEAR WITH  DR NISHAN You will receive a reminder letter in the mail two months in advance. If you don't receive a letter, please call our office to schedule the follow-up appointment.   Any Other Special Instructions Will Be Listed Below (If Applicable).   

## 2014-09-22 DIAGNOSIS — J040 Acute laryngitis: Secondary | ICD-10-CM | POA: Diagnosis not present

## 2014-09-22 DIAGNOSIS — J069 Acute upper respiratory infection, unspecified: Secondary | ICD-10-CM | POA: Diagnosis not present

## 2014-09-22 DIAGNOSIS — J029 Acute pharyngitis, unspecified: Secondary | ICD-10-CM | POA: Diagnosis not present

## 2014-09-29 ENCOUNTER — Encounter: Payer: Self-pay | Admitting: Internal Medicine

## 2014-10-26 ENCOUNTER — Ambulatory Visit (AMBULATORY_SURGERY_CENTER): Payer: Self-pay | Admitting: *Deleted

## 2014-10-26 VITALS — Ht 61.5 in | Wt 168.8 lb

## 2014-10-26 DIAGNOSIS — Z1211 Encounter for screening for malignant neoplasm of colon: Secondary | ICD-10-CM

## 2014-10-26 NOTE — Progress Notes (Signed)
No egg or soy allergy Pt states doesn't take a lot to sedate her but no issues with past sedation No home 02 use No diet pills emmi declined Pt having right sided abd pain for the last few months .  She questions adhesions, cysts or diverticulosis. PCP told her to tell Dr Olevia Perches. No issues with bm's, no bleeding , fever

## 2014-11-09 ENCOUNTER — Encounter: Payer: Self-pay | Admitting: Internal Medicine

## 2014-11-09 ENCOUNTER — Ambulatory Visit (AMBULATORY_SURGERY_CENTER): Payer: Medicare Other | Admitting: Internal Medicine

## 2014-11-09 VITALS — BP 126/81 | HR 71 | Temp 97.7°F | Resp 16 | Ht 61.5 in | Wt 168.8 lb

## 2014-11-09 DIAGNOSIS — R109 Unspecified abdominal pain: Secondary | ICD-10-CM | POA: Diagnosis not present

## 2014-11-09 DIAGNOSIS — I10 Essential (primary) hypertension: Secondary | ICD-10-CM | POA: Diagnosis not present

## 2014-11-09 DIAGNOSIS — Z1211 Encounter for screening for malignant neoplasm of colon: Secondary | ICD-10-CM

## 2014-11-09 DIAGNOSIS — D120 Benign neoplasm of cecum: Secondary | ICD-10-CM

## 2014-11-09 MED ORDER — SODIUM CHLORIDE 0.9 % IV SOLN: 500.0000 mL | INTRAVENOUS | Status: DC

## 2014-11-09 NOTE — Progress Notes (Signed)
Called to room to assist during endoscopic procedure.  Patient ID and intended procedure confirmed with present staff. Received instructions for my participation in the procedure from the performing physician.  

## 2014-11-09 NOTE — Progress Notes (Signed)
Patient denies any allergies to eggs or soy. 

## 2014-11-09 NOTE — Patient Instructions (Signed)
YOU HAD AN ENDOSCOPIC PROCEDURE TODAY AT THE [facility]:   Refer to the procedure report that was given to you for any specific questions about what was found during the examination.  If the procedure report does not answer your questions, please call your gastroenterologist to clarify.  If you requested that your care partner not be given the details of your procedure findings, then the procedure report has been included in a sealed envelope for you to review at your convenience later.  YOU SHOULD EXPECT: Some feelings of bloating in the abdomen. Passage of more gas than usual.  Walking can help get rid of the air that was put into your GI tract during the procedure and reduce the bloating. If you had a lower endoscopy (such as a colonoscopy or flexible sigmoidoscopy) you may notice spotting of blood in your stool or on the toilet paper. If you underwent a bowel prep for your procedure, you may not have a normal bowel movement for a few days.  Please Note:  You might notice some irritation and congestion in your nose or some drainage.  This is from the oxygen used during your procedure.  There is no need for concern and it should clear up in a day or so.  SYMPTOMS TO REPORT IMMEDIATELY:   Following lower endoscopy (colonoscopy or flexible sigmoidoscopy):  Excessive amounts of blood in the stool  Significant tenderness or worsening of abdominal pains  Swelling of the abdomen that is new, acute  Fever of 100F or higher   For urgent or emergent issues, a gastroenterologist can be reached at any hour by calling [phone].   DIET: Your first meal following the procedure should be a small meal and then it is ok to progress to your normal diet. Heavy or fried foods are harder to digest and may make you feel nauseous or bloated.  Likewise, meals heavy in dairy and vegetables can increase bloating.  Drink plenty of fluids but you should avoid alcoholic beverages for 24  hours.  ACTIVITY:  You should plan to take it easy for the rest of today and you should NOT DRIVE or use heavy machinery until tomorrow (because of the sedation medicines used during the test).    FOLLOW UP: Our staff will call the number listed on your records the next business day following your procedure to check on you and address any questions or concerns that you may have regarding the information given to you following your procedure. If we do not reach you, we will leave a message.  However, if you are feeling well and you are not experiencing any problems, there is no need to return our call.  We will assume that you have returned to your regular daily activities without incident.  If any biopsies were taken you will be contacted by phone or by letter within the next 1-3 weeks.  Please call us at [phone] if you have not heard about the biopsies in 3 weeks.    SIGNATURES/CONFIDENTIALITY: You and/or your care partner have signed paperwork which will be entered into your electronic medical record.  These signatures attest to the fact that that the information above on your After Visit Summary has been reviewed and is understood.  Full responsibility of the confidentiality of this discharge information lies with you and/or your care-partner. 

## 2014-11-09 NOTE — Progress Notes (Signed)
Transferred to recovery room. A/O x3, pleased with MAC.  VSS.  Report to Karen, RN. 

## 2014-11-09 NOTE — Op Note (Signed)
[place]  [address] [place], [address]   COLONOSCOPY PROCEDURE REPORT  PATIENT: Jamie Jensen, Jamie Jensen  MR#: [id number] BIRTHDATE: Apr 19, 1946 , 23  yrs. old GENDER: female ENDOSCOPIST: Lafayette Dragon, MD REFERRED IP:[id number] Delight Hoh, M.D. PROCEDURE DATE:  11/09/2014 PROCEDURE:   Colonoscopy, screening and Colonoscopy with snare polypectomy First Screening Colonoscopy - Avg.  risk and is 50 yrs.  old or older - No.  Prior Negative Screening - Now for repeat screening. 10 or more years since last screening  History of Adenoma - Now for follow-up colonoscopy & has been > or = to 3 yrs.  N/A  Polyps removed today? Yes ASA CLASS:   Class II INDICATIONS:Screening for colonic neoplasia, Colorectal Neoplasm Risk Assessment for this procedure is average risk, and prior colonoscopy in May 2006 showed mild diverticulosis. MEDICATIONS: Monitored anesthesia care and Propofol 200 mg IV  DESCRIPTION OF PROCEDURE:   After the risks benefits and alternatives of the procedure were thoroughly explained, informed consent was obtained.  The digital rectal exam revealed no abnormalities of the rectum.   The LB [id number]  endoscope was introduced through the anus and advanced to the cecum, which was identified by both the appendix and ileocecal valve. No adverse events experienced.   The quality of the prep was good.  (MoviPrep was used)  The instrument was then slowly withdrawn as the colon was fully examined. Estimated blood loss is zero unless otherwise noted in this procedure report.      COLON FINDINGS: A firm sessile polyp measuring 6 mm in size was found at the cecum.  A polypectomy was performed with a cold snare. The resection was complete, the polyp tissue was completely retrieved and sent to histology.   There was mild diverticulosis noted throughout the entire examined colon with associated muscular hypertrophy.   Small internal hemorrhoids were found.   Retroflexed views revealed no abnormalities. The time to cecum = 5.09 Withdrawal time = 9.37   The scope was withdrawn and the procedure completed. COMPLICATIONS: There were no immediate complications.  ENDOSCOPIC IMPRESSION: 1.   Sessile polyp was found at the cecum; polypectomy was performed with a cold snare 2.   There was mild diverticulosis noted throughout the entire examined colon 3.   Small internal hemorrhoids  RECOMMENDATIONS: 1.  Await pathology results 2.  High fiber diet Recall colonoscopy pending path report  eSigned:  Lafayette Dragon, MD 11/09/2014 8:02 AM   cc:   PATIENT NAME:  Jamie Jensen, Jamie Jensen MR#: [id number]

## 2014-11-10 ENCOUNTER — Telehealth: Payer: Self-pay

## 2014-11-10 NOTE — Telephone Encounter (Signed)
Left message on answering machine. 

## 2014-11-14 ENCOUNTER — Encounter: Payer: Self-pay | Admitting: Internal Medicine

## 2014-11-16 DIAGNOSIS — K219 Gastro-esophageal reflux disease without esophagitis: Secondary | ICD-10-CM | POA: Diagnosis not present

## 2014-11-16 DIAGNOSIS — I10 Essential (primary) hypertension: Secondary | ICD-10-CM | POA: Diagnosis not present

## 2014-11-16 DIAGNOSIS — R1031 Right lower quadrant pain: Secondary | ICD-10-CM | POA: Diagnosis not present

## 2014-11-17 ENCOUNTER — Other Ambulatory Visit: Payer: Self-pay | Admitting: Internal Medicine

## 2014-11-17 DIAGNOSIS — R1031 Right lower quadrant pain: Secondary | ICD-10-CM

## 2014-11-18 ENCOUNTER — Ambulatory Visit
Admission: RE | Admit: 2014-11-18 | Discharge: 2014-11-18 | Disposition: A | Discharge status: Home / Self Care | Payer: Medicare Other | Source: Ambulatory Visit | Attending: Internal Medicine | Admitting: Internal Medicine

## 2014-11-18 DIAGNOSIS — K5900 Constipation, unspecified: Secondary | ICD-10-CM | POA: Diagnosis not present

## 2014-11-18 DIAGNOSIS — K7689 Other specified diseases of liver: Secondary | ICD-10-CM | POA: Diagnosis not present

## 2014-11-18 DIAGNOSIS — R1031 Right lower quadrant pain: Secondary | ICD-10-CM

## 2014-11-18 DIAGNOSIS — R110 Nausea: Secondary | ICD-10-CM | POA: Diagnosis not present

## 2014-11-18 MED ORDER — IOPAMIDOL (ISOVUE-300) INJECTION 61%
100.0000 mL | Freq: Once | INTRAVENOUS | Status: AC | PRN
  Administered 2014-11-18: 100 mL via INTRAVENOUS

## 2014-12-05 DIAGNOSIS — E049 Nontoxic goiter, unspecified: Secondary | ICD-10-CM | POA: Diagnosis not present

## 2014-12-23 DIAGNOSIS — R1011 Right upper quadrant pain: Secondary | ICD-10-CM | POA: Diagnosis not present

## 2015-02-11 IMAGING — US US SOFT TISSUE HEAD/NECK
1 series · 14 of 25 positions shown · non-contrast
Comparison: 04/26/2013 and earlier studies

CLINICAL DATA: GOITER

EXAM:
THYROID ULTRASOUND
TECHNIQUE: Ultrasound examination of the thyroid gland and adjacent soft
tissues was performed.

[Series 1: us soft tissue head/neck · 0.09mm/px · 14 of 63 slices shown]
[im 1/63]
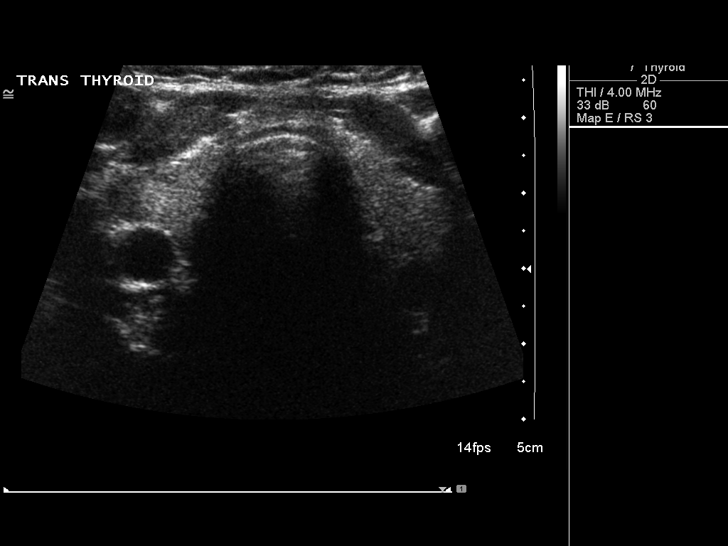
[im 6/63]
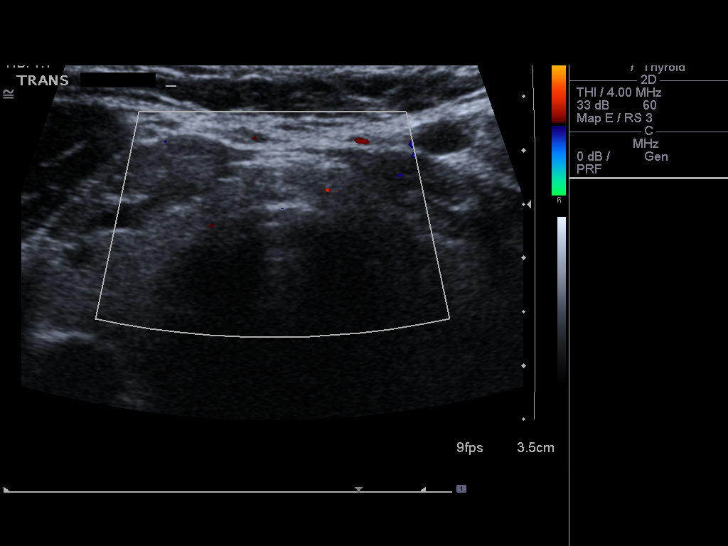
[im 11/63]
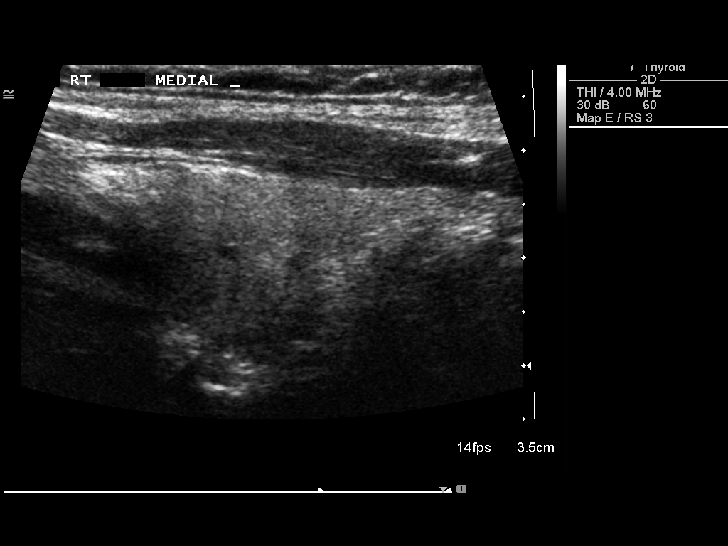
[im 16/63]
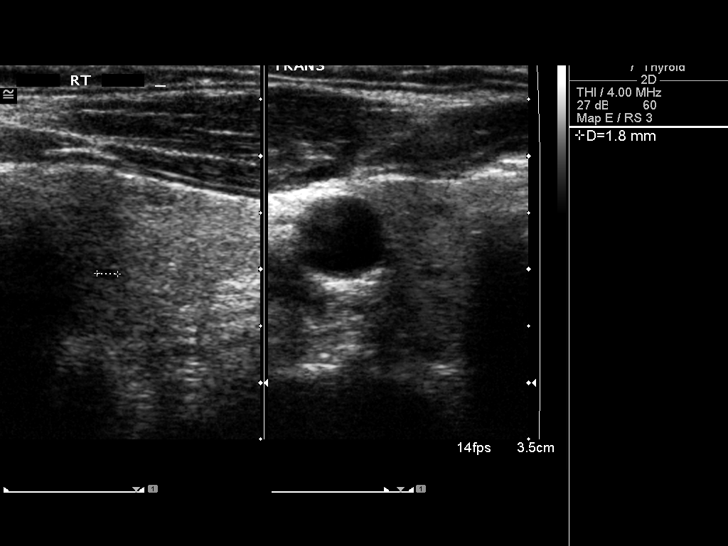
[im 21/63]
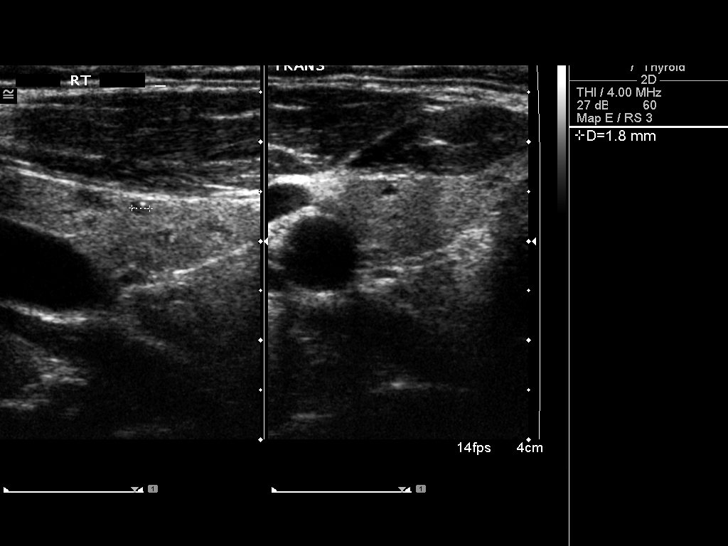
[im 24/63]
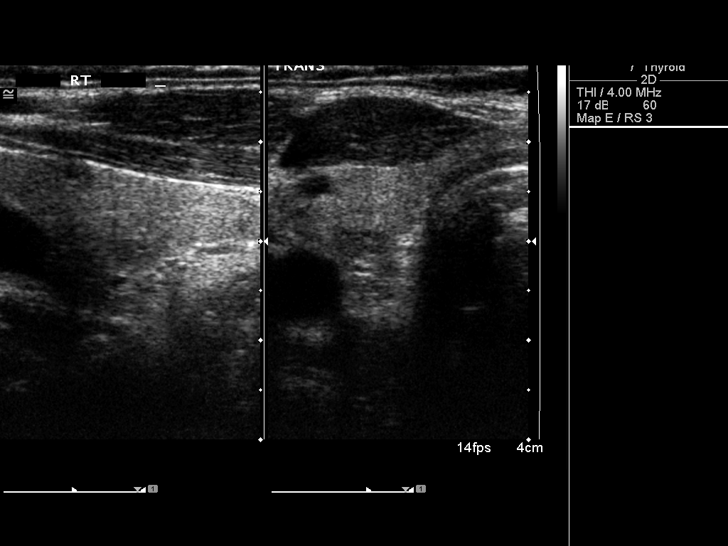
[im 29/63]
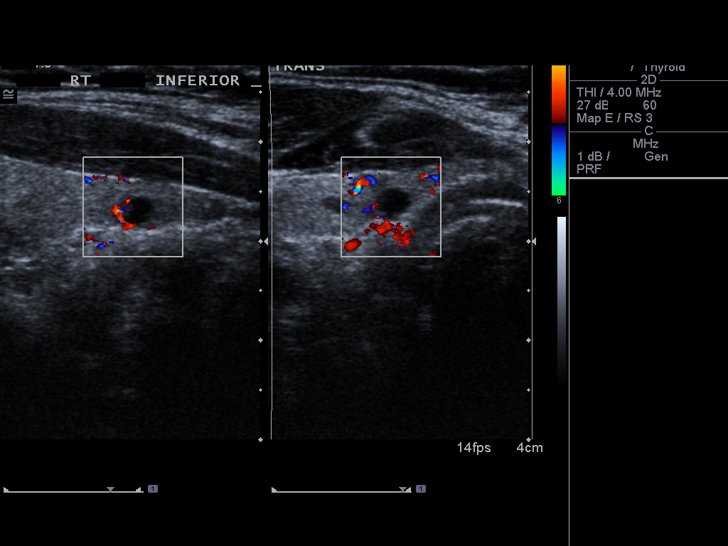
[im 34/63]
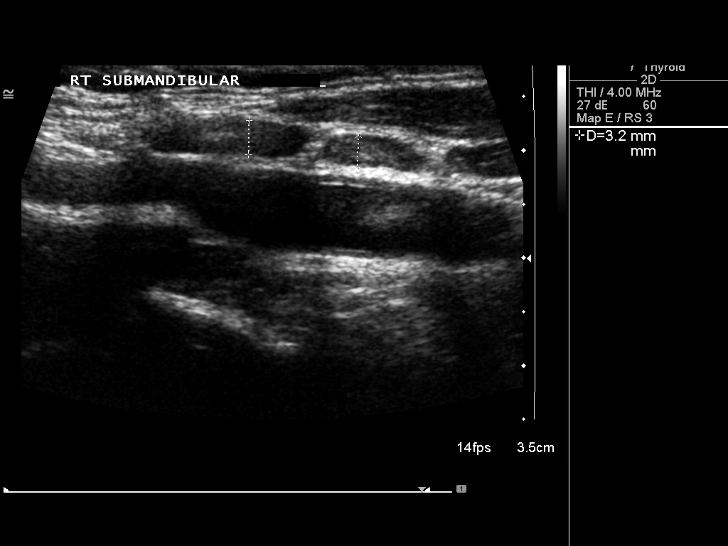
[im 39/63]
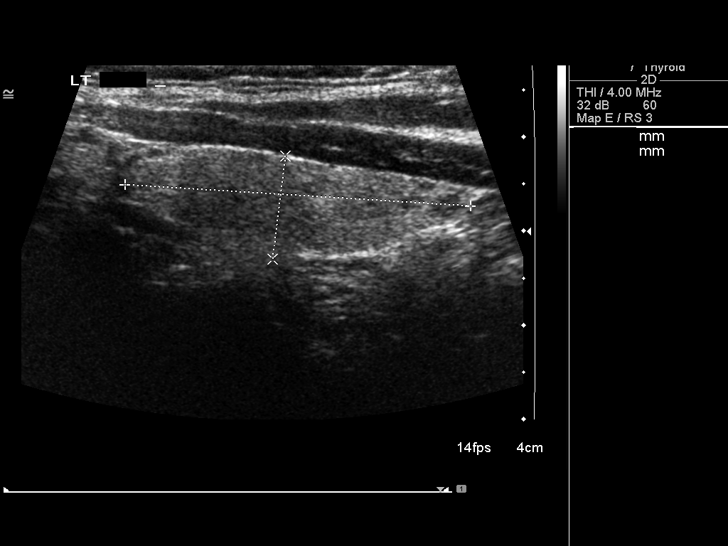
[im 42/63]
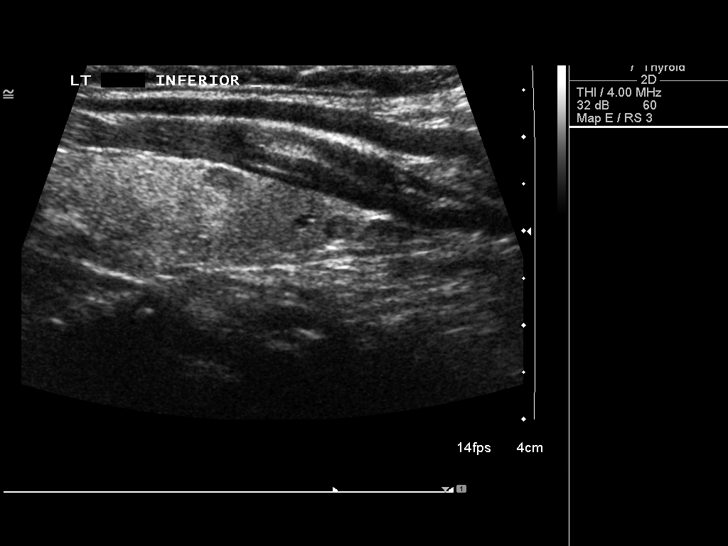
[im 47/63]
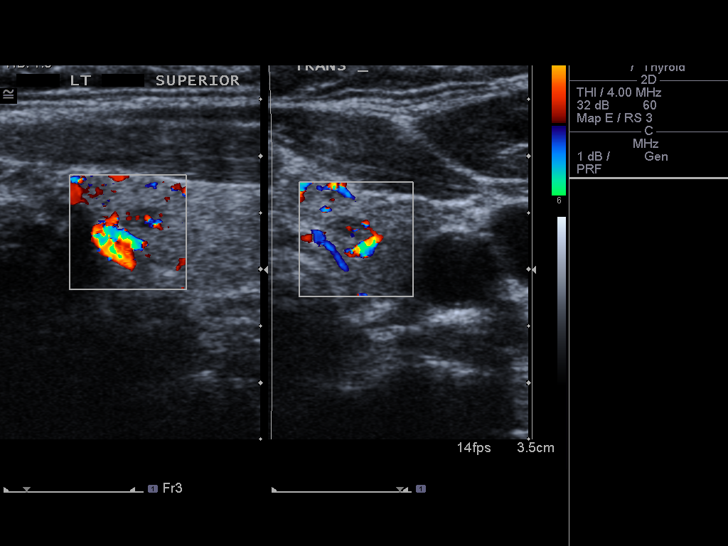
[im 52/63]
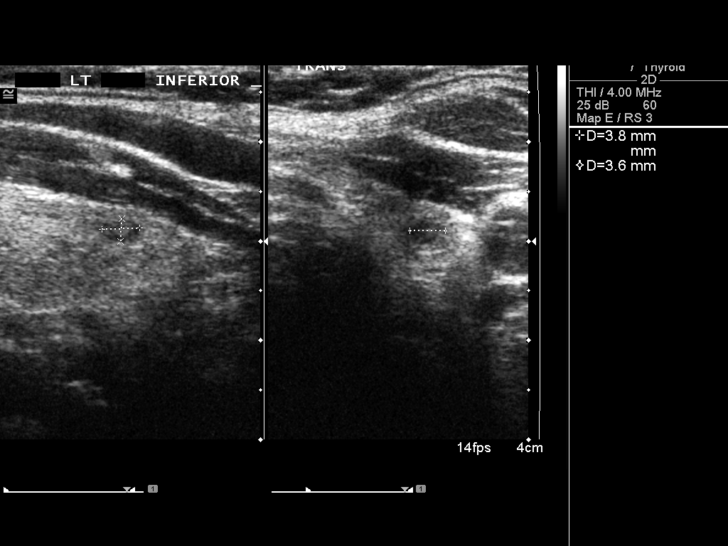
[im 57/63]
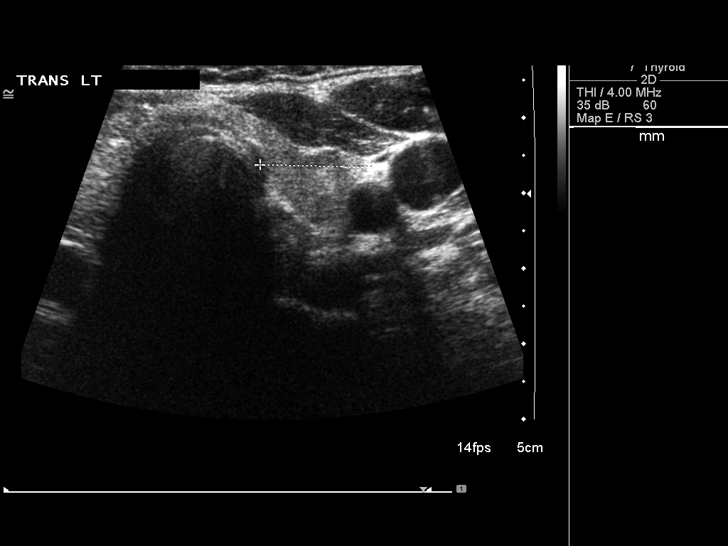
[im 63/63]
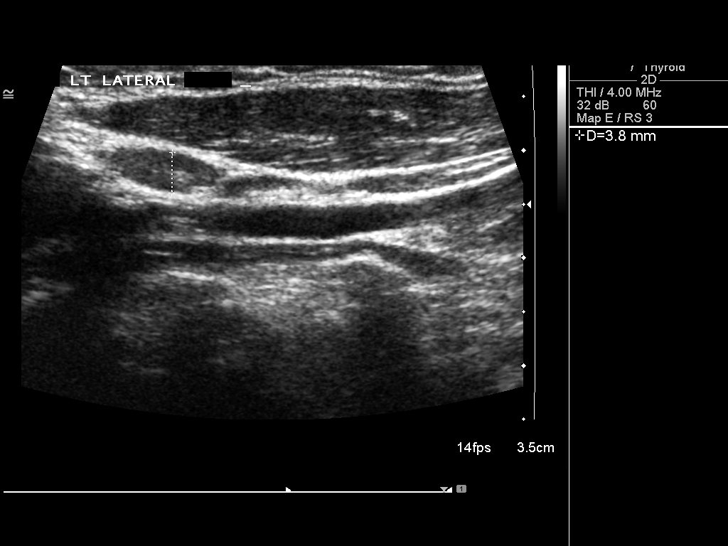

[14 of 25 positions shown; findings below may reference images not displayed]

FINDINGS: Right thyroid lobe

Measurements: 34 x 16 x 17 mm. At least 9 small hypoechoic/cystic
nodules all less than 5 mm.

Left thyroid lobe

Measurements: 37 x 11 x 15 mm. At least 6 small hypoechoic/ cystic
nodules, 5 mm or less in maximum diameter.

Isthmus

Thickness: 2 mm.  No nodules visualized.

Lymphadenopathy

None visualized.
IMPRESSION: 1. Normal-sized thyroid with multiple small nodules and cysts, no
dominant lesion. Findings do not meet current consensus criteria for
biopsy. Follow-up by clinical exam is recommended. If patient has
known risk factors for thyroid carcinoma, consider follow-up
ultrasound in 12 months. If patient is clinically hyperthyroid,
consider nuclear medicine thyroid uptake and scan. This
recommendation follows the consensus statement: Management of
Thyroid Nodules Detected as US: Society of Radiologists in
Ultrasound Consensus Conference Statement. Radiology 4991;

## 2015-05-19 DIAGNOSIS — Z1231 Encounter for screening mammogram for malignant neoplasm of breast: Secondary | ICD-10-CM | POA: Diagnosis not present

## 2015-05-19 DIAGNOSIS — Z124 Encounter for screening for malignant neoplasm of cervix: Secondary | ICD-10-CM | POA: Diagnosis not present

## 2015-05-19 DIAGNOSIS — Z6833 Body mass index (BMI) 33.0-33.9, adult: Secondary | ICD-10-CM | POA: Diagnosis not present

## 2015-05-24 DIAGNOSIS — H6981 Other specified disorders of Eustachian tube, right ear: Secondary | ICD-10-CM | POA: Diagnosis not present

## 2015-06-02 ENCOUNTER — Other Ambulatory Visit: Payer: Self-pay | Admitting: Endocrinology

## 2015-06-02 DIAGNOSIS — E049 Nontoxic goiter, unspecified: Secondary | ICD-10-CM

## 2015-09-08 DIAGNOSIS — H52223 Regular astigmatism, bilateral: Secondary | ICD-10-CM | POA: Diagnosis not present

## 2015-09-08 DIAGNOSIS — H04123 Dry eye syndrome of bilateral lacrimal glands: Secondary | ICD-10-CM | POA: Diagnosis not present

## 2015-09-08 DIAGNOSIS — H5212 Myopia, left eye: Secondary | ICD-10-CM | POA: Diagnosis not present

## 2015-09-08 DIAGNOSIS — H25813 Combined forms of age-related cataract, bilateral: Secondary | ICD-10-CM | POA: Diagnosis not present

## 2015-09-18 NOTE — Progress Notes (Signed)
Patient ID: Jamie Jensen, female   DOB: March 19, 1946, 70 y.o.   MRN: [id number]   70 y.o. patient of Dr Shelia Media  . History of obesity and HTN Over the years has had steriotypical episodes of recumbant resting syncope. No palpitations chest pain. Feels like she is going out and then gets headache and some mild dyspnea. Post event feels heart beating hard and fast. Previous w/u over 5 years ago normal No chestp pain Walks at brisk pace regularly with no issues. Spells are not related to exercise, postural changes.  Has a multinodular gointer No TSH on file [place] with no worrisome nodules  2014 :  Normal stress echo reviewed   ROS: Denies fever, malais, weight loss, blurry vision, decreased visual acuity, cough, sputum, SOB, hemoptysis, pleuritic pain, palpitaitons, heartburn, abdominal pain, melena, lower extremity edema, claudication, or rash. All other systems reviewed and negative  May 6th 16  LDL 107   Normal LFTls   TSH normal  Husbands health poor has trigeminal neuralgia and depression  Dr Shelia Media worried about her ECG  I reviewed multiple ECGls and she has chronic LAD and poor R wave progression read as septal infarct Which is just from lead placement and body habitus  Was at a health fair and told she had poor circulation no claudication    ROS: Denies fever, malais, weight loss, blurry vision, decreased visual acuity, cough, sputum, SOB, hemoptysis, pleuritic pain, palpitaitons, heartburn, abdominal pain, melena, lower extremity edema, claudication, or rash.  All other systems reviewed and negative  General: Affect appropriate Healthy:  appears stated age 70: normal Neck supple with no adenopathy JVP normal no bruits no thyromegaly Lungs clear with no wheezing and good diaphragmatic motion Heart:  S1/S2 no murmur, no rub, gallop or click PMI normal Abdomen: benighn, BS positve, no tenderness, no AAA no bruit.  No HSM or HJR Distal pulses intact with no bruits No  edema Neuro non-focal Skin warm and dry No muscular weakness   Current Outpatient Prescriptions  Medication Sig Dispense Refill  . cholecalciferol (VITAMIN D) 1000 UNITS tablet Take 1,000 Units by mouth once a week.     . pantoprazole (PROTONIX) 40 MG tablet Take 40 mg by mouth daily.    Marland Kitchen triamterene-hydrochlorothiazide (DYAZIDE) 37.5-25 MG per capsule Take 1 capsule by mouth every morning.     No current facility-administered medications for this visit.    Allergies  Latex  Electrocardiogram:  SR rate 75  Normal ECG 07/30/13  NSR normal no change  09/25/15  SR rate 61 LAD low voltage   Assessment and Plan Chest Pain:  Resolved previously normal stress echo stable GERD:  On prilosec stable low carb diet Goiter:  Seeing Dr Soyla Murphy TSH normal f/u US HTN:  Low sodium DASH diet continue diuretic ECG:  Stable no changes LAD poor R wave progression Circulation normal by exam and no symptoms no need for f/u ABI's    Jamie Jensen

## 2015-09-25 ENCOUNTER — Ambulatory Visit (INDEPENDENT_AMBULATORY_CARE_PROVIDER_SITE_OTHER): Payer: Medicare Other | Admitting: Cardiovascular Disease

## 2015-09-25 ENCOUNTER — Encounter: Payer: Self-pay | Admitting: Cardiovascular Disease

## 2015-09-25 VITALS — BP 142/80 | HR 59 | Ht 61.5 in | Wt 180.8 lb

## 2015-09-25 DIAGNOSIS — Z09 Encounter for follow-up examination after completed treatment for conditions other than malignant neoplasm: Secondary | ICD-10-CM | POA: Diagnosis not present

## 2015-09-25 NOTE — Patient Instructions (Addendum)

## 2015-09-28 ENCOUNTER — Encounter: Payer: Self-pay | Admitting: *Deleted

## 2015-09-28 NOTE — Progress Notes (Signed)
This encounter was created in error - please disregard.

## 2015-10-26 DIAGNOSIS — E049 Nontoxic goiter, unspecified: Secondary | ICD-10-CM | POA: Diagnosis not present

## 2015-10-26 DIAGNOSIS — M859 Disorder of bone density and structure, unspecified: Secondary | ICD-10-CM | POA: Diagnosis not present

## 2015-10-26 DIAGNOSIS — K219 Gastro-esophageal reflux disease without esophagitis: Secondary | ICD-10-CM | POA: Diagnosis not present

## 2015-10-26 DIAGNOSIS — M8580 Other specified disorders of bone density and structure, unspecified site: Secondary | ICD-10-CM | POA: Diagnosis not present

## 2015-10-26 DIAGNOSIS — Z23 Encounter for immunization: Secondary | ICD-10-CM | POA: Diagnosis not present

## 2015-10-26 DIAGNOSIS — M810 Age-related osteoporosis without current pathological fracture: Secondary | ICD-10-CM | POA: Diagnosis not present

## 2015-10-26 DIAGNOSIS — I10 Essential (primary) hypertension: Secondary | ICD-10-CM | POA: Diagnosis not present

## 2015-10-26 DIAGNOSIS — Z0000 Encounter for general adult medical examination without abnormal findings: Secondary | ICD-10-CM | POA: Diagnosis not present

## 2015-11-02 DIAGNOSIS — K439 Ventral hernia without obstruction or gangrene: Secondary | ICD-10-CM | POA: Diagnosis not present

## 2015-11-02 DIAGNOSIS — M810 Age-related osteoporosis without current pathological fracture: Secondary | ICD-10-CM | POA: Diagnosis not present

## 2015-11-02 DIAGNOSIS — K219 Gastro-esophageal reflux disease without esophagitis: Secondary | ICD-10-CM | POA: Diagnosis not present

## 2015-11-02 DIAGNOSIS — I10 Essential (primary) hypertension: Secondary | ICD-10-CM | POA: Diagnosis not present

## 2015-11-27 ENCOUNTER — Telehealth: Payer: Self-pay

## 2015-11-27 ENCOUNTER — Telehealth: Payer: Self-pay | Admitting: Internal Medicine

## 2015-11-27 NOTE — Telephone Encounter (Signed)
A user error has taken place.

## 2015-11-27 NOTE — Telephone Encounter (Signed)
Spoke with patient and she states she started having pain under left ribs on Friday. States it has increased since then. States belches helps it some but needs some relief. Scheduled with Jamie Jensen on 11/28/15 at 3:30 PM

## 2015-11-28 ENCOUNTER — Encounter: Payer: Self-pay | Admitting: Physician Assistant

## 2015-11-28 ENCOUNTER — Ambulatory Visit
Admission: RE | Admit: 2015-11-28 | Discharge: 2015-11-28 | Disposition: A | Discharge status: Home / Self Care | Payer: Medicare Other | Source: Ambulatory Visit | Attending: Endocrinology | Admitting: Endocrinology

## 2015-11-28 ENCOUNTER — Ambulatory Visit (INDEPENDENT_AMBULATORY_CARE_PROVIDER_SITE_OTHER): Payer: Medicare Other | Admitting: Physician Assistant

## 2015-11-28 VITALS — BP 128/76 | HR 115 | Ht 61.0 in | Wt 176.2 lb

## 2015-11-28 DIAGNOSIS — R1012 Left upper quadrant pain: Secondary | ICD-10-CM

## 2015-11-28 DIAGNOSIS — E042 Nontoxic multinodular goiter: Secondary | ICD-10-CM | POA: Diagnosis not present

## 2015-11-28 DIAGNOSIS — R197 Diarrhea, unspecified: Secondary | ICD-10-CM | POA: Diagnosis not present

## 2015-11-28 DIAGNOSIS — E049 Nontoxic goiter, unspecified: Secondary | ICD-10-CM

## 2015-11-28 DIAGNOSIS — R110 Nausea: Secondary | ICD-10-CM

## 2015-11-28 MED ORDER — ONDANSETRON 4 MG PO TBDP: ORAL_TABLET | ORAL | 0 refills | Status: DC

## 2015-11-28 NOTE — Progress Notes (Signed)
Chief Complaint: LUQ abdominal pain, nausea, diarrhea  HPI:  Jamie Jensen is a  70 y/o Caucasian female  who was referred to me by Deland Pretty, MD for a complaint of LUQ pain, nausea and diarrhea . She has followed with Dr. Olevia Perches in the past.  Independent review of patient's last colonoscopy report and images from 11/09/14 reveals findings of a sessile polyp in the cecum, mild diverticulosis throughout the entire examined colon and small internal hemorrhoids. Pathology revealed a tubular adenoma. An independent review of patient's last EGD report and images from 03/31/13 revealed findings of grade 2 esophagitis and antral gastritis with erosions and small superficial ulceration. Pathology revealed chronically inflamed squamous and cardiac type mucosa in the GE junction.  Today, the patient tells me that she has been on Pantoprazole 40 mg once daily every day since being seen for her last EGD in 2014. Within the past week this was changed to Ranitidine 150 mg once daily by her primary care physician. She is uncertain why this occurred, but ever since then has been having an increase in reflux as well as a left sided abdominal pain. She tells me this pain increased and worsened starting on Thursday, 11/23/14 and became severe on Saturday/Sunday where she  "could not even stand up". The patient tried everything she had at home including ginger ale and Phazyme, which did not help at all. She was also taking "Nausene", for nausea which accompanied this pain. Just yesterday she started with diarrhea as well. Associated symptoms include a low-grade fever around 100.4 on Saturday.   Patient denies vomiting, dysphagia, symptoms that awaken her at night, chills, blood in her stool, melena, weight loss, anorexia, other changes in medications or sick contacts.   Past Medical History:  Diagnosis Date  . Anal fissure   . Anemia    in the past with surgery but no transfusion  . CELLULITIS/ABSCESS NOS 07/04/2009    . Chronic sore throat   . Diverticulosis   . Endometriosis   . Gallstones   . Hx of gastric ulcer   . Hypertension   . OSTEOPOROSIS 07/03/2007  . Other diseases of nasal cavity and sinuses(478.19) 06/02/2009  . Snores     Past Surgical History:  Procedure Laterality Date  . BLADDER REPAIR  1993   post perforation during surgery for endometriosis  . BREAST BIOPSY  [id number]   rt and lt-negative  . CESAREAN SECTION     x 2  . CHOLECYSTECTOMY  2000  . COLONOSCOPY  09-28-2004   tics only  . complete hysterectomy  1985   following partial hysterectomy  . DILATION AND CURETTAGE OF UTERUS    . LAPAROSCOPIC ENDOMETRIOSIS FULGURATION  1993   with subsequent bladder perforation  . PARTIAL HYSTERECTOMY    . ROTATOR CUFF REPAIR Right   . SHOULDER ARTHROSCOPY  2010   right  . TONSILLECTOMY AND ADENOIDECTOMY Bilateral 08/09/2013   Procedure: BILATERAL TONSILLECTOMY ;  Surgeon: Ascencion Dike, MD;  Location: [facility];  Service: ENT;  Laterality: Bilateral;    Current Outpatient Prescriptions  Medication Sig Dispense Refill  . ranitidine (ZANTAC) 150 MG tablet Take 150 mg by mouth 2 (two) times daily.    Marland Kitchen triamterene-hydrochlorothiazide (DYAZIDE) 37.5-25 MG per capsule Take 1 capsule by mouth every morning.    . ondansetron (ZOFRAN ODT) 4 MG disintegrating tablet Place 1 tablet on the tongue to dissolve , every 4 hours as needed for nausea. 40 tablet 0   No  current facility-administered medications for this visit.     Allergies as of 11/28/2015 - Review Complete 11/28/2015  Allergen Reaction Noted  . Latex Other (See Comments) 10/26/2014    Family History  Problem Relation Age of Onset  . Prostate cancer    . Breast cancer Maternal Aunt     x 2  . Pancreatic cancer Mother   . Kidney cancer Mother   . Heart disease Father   . Colon cancer Neg Hx   . Rectal cancer Neg Hx   . Stomach cancer Neg Hx   . Lung cancer Father     smoker    Social History   Social  History  . Marital status: Married    Spouse name: N/A  . Number of children: N/A  . Years of education: N/A   Occupational History  . Not on file.   Social History Main Topics  . Smoking status: Never Smoker  . Smokeless tobacco: Never Used  . Alcohol use 0.0 oz/week     Comment: occasionaly  . Drug use: No  . Sexual activity: Not on file   Other Topics Concern  . Not on file   Social History Narrative  . No narrative on file    Review of Systems:    Constitutional: Positive for low-grade fever No weight loss or chills HEENT: Eyes: No change in vision               Ears, Nose, Throat:  No change in hearing or sore throat Skin: No rash or itching Cardiovascular: No chest pain, chest pressure or palpitations     Respiratory: No SOB or cough Gastrointestinal: See HPI and otherwise negative Genitourinary: No dysuria or change in urinary frequency Neurological: No headache, dizziness or syncope Musculoskeletal: No new muscle or back pain Hematologic: No bleeding Psychiatric: No history of depression or anxiety   Physical Exam:  Vital signs: BP 128/76 (BP Location: Left Arm, Patient Position: Sitting)   Pulse (!) 115   Ht 5\' 1"  (1.549 m)   Wt 176 lb 3.2 oz (79.9 kg)   SpO2 96%   BMI 33.29 kg/m   General:   Pleasant Caucasian female appears to be in NAD, Well developed, Well nourished, alert and cooperative Head:  Normocephalic and atraumatic. Eyes:   PEERL, EOMI. No icterus. Conjunctiva pink. Ears:  Normal auditory acuity. Neck:  Supple Throat: Oral cavity and pharynx without inflammation, swelling or lesion. Teeth in good condition. Lungs: Respirations even and unlabored. Lungs clear to auscultation bilaterally.   No wheezes, crackles, or rhonchi.  Heart: Normal S1, S2. No MRG. Regular rate and rhythm. No peripheral edema, cyanosis or pallor.  Abdomen:  Soft, nondistended, mild TTP on left side of abdomen, somewhat worse in left lower quadrant. No rebound or  guarding. Normal bowel sounds. No appreciable masses or hepatomegaly. Rectal:  Not performed.  Msk:  Symmetrical without gross deformities. Peripheral pulses intact.  Extremities:  Without edema, no deformity or joint abnormality.  Neurologic:  Alert and  oriented x4;  grossly normal neurologically.  Skin:   Dry and intact without significant lesions or rashes. Psychiatric: Oriented to person, place and time. Demonstrates good judgement and reason without abnormal affect or behaviors.  RELEVANT LABS AND IMAGING: CBC    Component Value Date/Time   HGB 15.7 (H) 08/09/2013 0905    CMP     Component Value Date/Time   BUN 12 03/29/2013 0909   CREATININE 0.9 03/29/2013 0909    Assessment: 1.  Left upper quadrant abdominal pain: Patient reports increase in abdominal pain after switching from pantoprazole to ranitidine associated with nausea, low-grade fever and diarrhea; consider gastritis versus PUD versus other. 2. Nausea: Along with above 3. Diarrhea: Patient reports 2 days of urgent loose bowel movements, this is after 4 days of left upper quadrant abdominal pain and nausea; consider relation to gastritis or/uncontrolled reflux versus viral versus infectious cause  Plan: 1. Discussed with the patient that likely she was changed to Ranitidine due to her osteopenia. We did discuss possible long-term side effects of her Pantoprazole including decrease in bone density. After much discussion and alternative options being given, the patient wishes to restart her Pantoprazole at 40 mg once daily. 2. Prescribe Zofran 4 mg tabs by mouth every 4-6 hours as needed for nausea 3. Explained to the patient that I would like her to call our office and let us know how she is doing in one week, if worsening of symptoms could discuss further evaluation including possible EGD and/or stool studies. 4. Patient should follow in clinic in 3-4 weeks or sooner if necessary.  [name], PA-C [facility] 11/28/2015, 3:45 PM  Cc: Deland Pretty, MD

## 2015-11-28 NOTE — Patient Instructions (Addendum)
Call our office in one week with a progress report. We sent a prescription for Zofran 4 mg to your pharmacy. [facility] [address], [place].

## 2015-12-04 DIAGNOSIS — I10 Essential (primary) hypertension: Secondary | ICD-10-CM | POA: Diagnosis not present

## 2015-12-04 DIAGNOSIS — M8580 Other specified disorders of bone density and structure, unspecified site: Secondary | ICD-10-CM | POA: Diagnosis not present

## 2015-12-04 NOTE — Progress Notes (Signed)
Thank you for sending this case to me. I have reviewed the entire note.    It seems an unusual symptom complex for medication change, and more like acute infection.  Please have nursing call her this week to see how she is feeling.  If no better, and still diarrhea, send stool for GI for O/P and C diff.  Also consider diverticulitis.

## 2015-12-05 DIAGNOSIS — E049 Nontoxic goiter, unspecified: Secondary | ICD-10-CM | POA: Diagnosis not present

## 2016-01-02 DIAGNOSIS — E559 Vitamin D deficiency, unspecified: Secondary | ICD-10-CM | POA: Diagnosis not present

## 2016-05-10 DIAGNOSIS — M1812 Unilateral primary osteoarthritis of first carpometacarpal joint, left hand: Secondary | ICD-10-CM | POA: Diagnosis not present

## 2016-05-13 DIAGNOSIS — H1045 Other chronic allergic conjunctivitis: Secondary | ICD-10-CM | POA: Diagnosis not present

## 2016-05-13 DIAGNOSIS — H43811 Vitreous degeneration, right eye: Secondary | ICD-10-CM | POA: Diagnosis not present

## 2016-05-30 DIAGNOSIS — Z23 Encounter for immunization: Secondary | ICD-10-CM | POA: Diagnosis not present

## 2016-06-17 DIAGNOSIS — D225 Melanocytic nevi of trunk: Secondary | ICD-10-CM | POA: Diagnosis not present

## 2016-07-26 DIAGNOSIS — J0100 Acute maxillary sinusitis, unspecified: Secondary | ICD-10-CM | POA: Diagnosis not present

## 2016-07-26 DIAGNOSIS — J209 Acute bronchitis, unspecified: Secondary | ICD-10-CM | POA: Diagnosis not present

## 2016-07-31 DIAGNOSIS — Z1231 Encounter for screening mammogram for malignant neoplasm of breast: Secondary | ICD-10-CM | POA: Diagnosis not present

## 2016-07-31 DIAGNOSIS — Z01419 Encounter for gynecological examination (general) (routine) without abnormal findings: Secondary | ICD-10-CM | POA: Diagnosis not present

## 2016-07-31 DIAGNOSIS — Z6836 Body mass index (BMI) 36.0-36.9, adult: Secondary | ICD-10-CM | POA: Diagnosis not present

## 2016-08-06 ENCOUNTER — Encounter: Payer: Self-pay | Admitting: Cardiovascular Disease

## 2016-08-16 NOTE — Progress Notes (Addendum)
Patient ID: Jamie Jensen, female   DOB: [birth year], 71 y.o.   MRN: [id number]   70 y.o. patient of Dr Shelia Media  . History of obesity and HTN Over the years has had steriotypical episodes of recumbant resting syncope. No palpitations chest pain. Feels like she is going out and then gets headache and some mild dyspnea. Post event feels heart beating hard and fast. Previous w/u over 5 years ago normal No chestp pain Walks at brisk pace regularly with no issues. Spells are not related to exercise, postural changes.  Has a multinodular gointer No TSH on file [place] with no worrisome nodules  2014 :  Normal stress echo reviewed   ROS: Denies fever, malais, weight loss, blurry vision, decreased visual acuity, cough, sputum, SOB, hemoptysis, pleuritic pain, palpitaitons, heartburn, abdominal pain, melena, lower extremity edema, claudication, or rash. All other systems reviewed and negative  May 6th 16  LDL 107   Normal LFTls   TSH normal  Husbands health poor has trigeminal neuralgia and depression  Dr Shelia Media worried about her ECG  I reviewed multiple ECGl's  and she has chronic LAD and poor R wave progression read as septal infarct Which is just from lead placement and body habitus  Was at a health fair and told she had poor circulation no claudication pulses normal on exam    ROS: Denies fever, malais, weight loss, blurry vision, decreased visual acuity, cough, sputum, SOB, hemoptysis, pleuritic pain, palpitaitons, heartburn, abdominal pain, melena, lower extremity edema, claudication, or rash.  All other systems reviewed and negative  General: Affect appropriate Healthy:  appears stated age 3: multinodular goiter  Neck supple with no adenopathy JVP normal no bruits no thyromegaly Lungs clear with no wheezing and good diaphragmatic motion Heart:  S1/S2 no murmur, no rub, gallop or click PMI normal Abdomen: benighn, BS positve, no tenderness, no AAA no bruit.  No HSM or HJR Distal pulses  intact with no bruits No edema Neuro non-focal Skin warm and dry No muscular weakness   Current Outpatient Prescriptions  Medication Sig Dispense Refill  . cholecalciferol (VITAMIN D) 400 units TABS tablet Take 400 Units by mouth daily.    . pantoprazole (PROTONIX) 40 MG tablet Take 40 mg by mouth daily.  2  . ranitidine (ZANTAC) 150 MG tablet Take 150 mg by mouth 2 (two) times daily.    Marland Kitchen triamterene-hydrochlorothiazide (DYAZIDE) 37.5-25 MG per capsule Take 1 capsule by mouth every morning.    . Vitamin D, Ergocalciferol, (DRISDOL) 50000 units CAPS capsule Take 1 capsule by mouth once a week.  0   No current facility-administered medications for this visit.     Allergies  Latex  Electrocardiogram:  SR rate 75  Normal ECG 07/30/13  NSR normal no change  09/25/15  SR rate 61 LAD low voltage 08/21/16  SR rate 47 RSR' LAD   Assessment and Plan Chest Pain:  Resolved previously normal stress echo stable GERD:  On prilosec stable low carb diet Goiter:  Seeing Dr Soyla Murphy TSH normal US done 11/28/15 mostly cystic  HTN:  Low sodium DASH diet continue diuretic ECG:  Stable no changes LAD poor R wave progression Circulation normal by exam and no symptoms no need for f/u ABI's  Syncope:  vasavagal no cardiac etiology determined   [name]

## 2016-08-21 ENCOUNTER — Encounter: Payer: Self-pay | Admitting: Cardiovascular Disease

## 2016-08-21 ENCOUNTER — Ambulatory Visit (INDEPENDENT_AMBULATORY_CARE_PROVIDER_SITE_OTHER): Payer: Medicare Other | Admitting: Cardiovascular Disease

## 2016-08-21 VITALS — BP 140/80 | HR 71 | Ht 61.5 in | Wt 186.8 lb

## 2016-08-21 DIAGNOSIS — R0789 Other chest pain: Secondary | ICD-10-CM

## 2016-08-21 DIAGNOSIS — Z09 Encounter for follow-up examination after completed treatment for conditions other than malignant neoplasm: Secondary | ICD-10-CM | POA: Diagnosis not present

## 2016-08-21 NOTE — Patient Instructions (Addendum)

## 2016-09-10 DIAGNOSIS — M25561 Pain in right knee: Secondary | ICD-10-CM | POA: Diagnosis not present

## 2016-10-04 ENCOUNTER — Ambulatory Visit: Payer: Medicare Other | Admitting: Cardiovascular Disease

## 2016-11-04 DIAGNOSIS — Z0000 Encounter for general adult medical examination without abnormal findings: Secondary | ICD-10-CM | POA: Diagnosis not present

## 2016-11-04 DIAGNOSIS — I10 Essential (primary) hypertension: Secondary | ICD-10-CM | POA: Diagnosis not present

## 2016-11-08 DIAGNOSIS — K439 Ventral hernia without obstruction or gangrene: Secondary | ICD-10-CM | POA: Diagnosis not present

## 2016-11-08 DIAGNOSIS — Z800 Family history of malignant neoplasm of digestive organs: Secondary | ICD-10-CM | POA: Diagnosis not present

## 2016-11-08 DIAGNOSIS — K219 Gastro-esophageal reflux disease without esophagitis: Secondary | ICD-10-CM | POA: Diagnosis not present

## 2016-11-08 DIAGNOSIS — I10 Essential (primary) hypertension: Secondary | ICD-10-CM | POA: Diagnosis not present

## 2017-01-19 IMAGING — US US SOFT TISSUE HEAD/NECK
1 series · 14 of 25 positions shown · non-contrast
Comparison: 12/20/2013, 04/26/2013 and 07/24/2012.

CLINICAL DATA: History of multinodular thyroid goiter.

EXAM:
THYROID ULTRASOUND
TECHNIQUE: Ultrasound examination of the thyroid gland and adjacent soft
tissues was performed.

[Series 1: us soft tissue head/neck · 0.07mm/px · 14 of 48 slices shown]
[im 1/48]
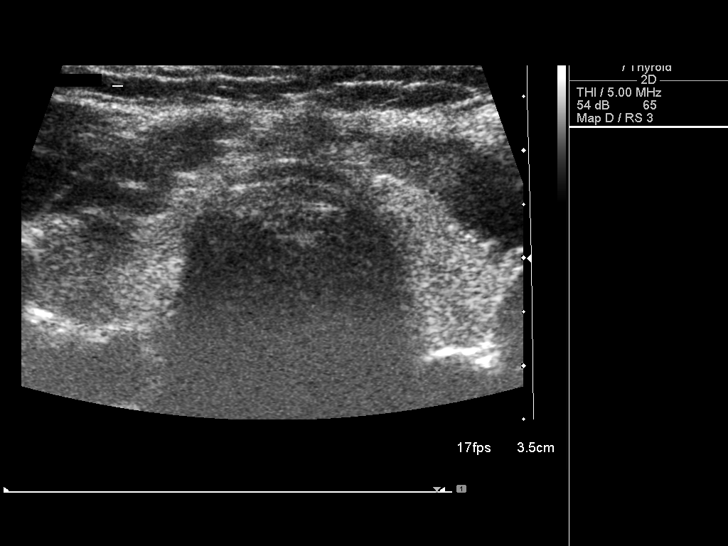
[im 4/48]
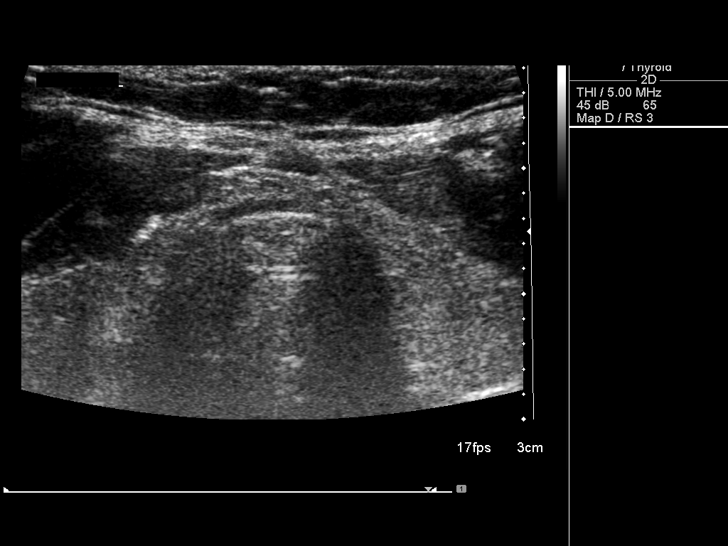
[im 8/48]
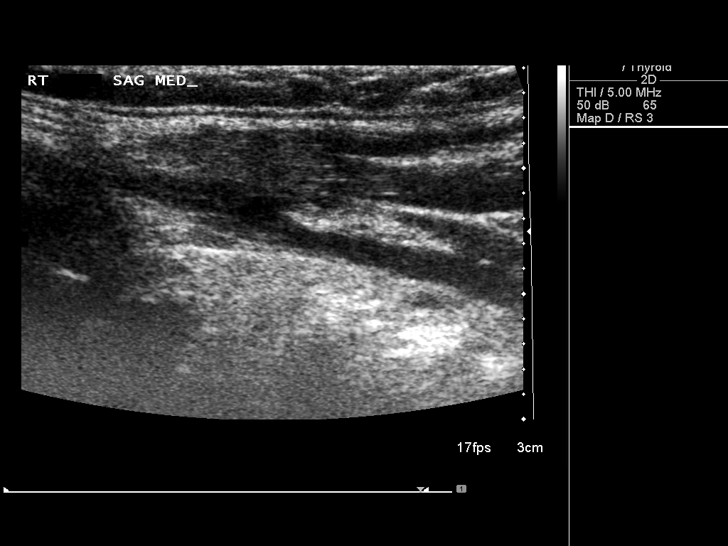
[im 12/48]
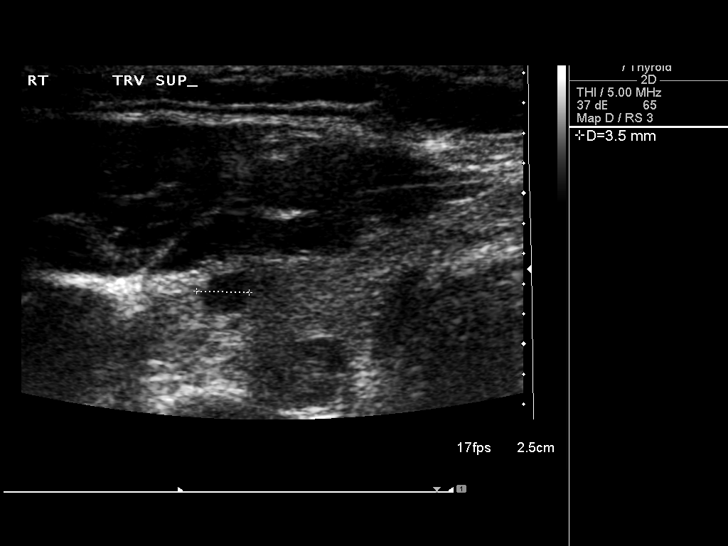
[im 16/48]
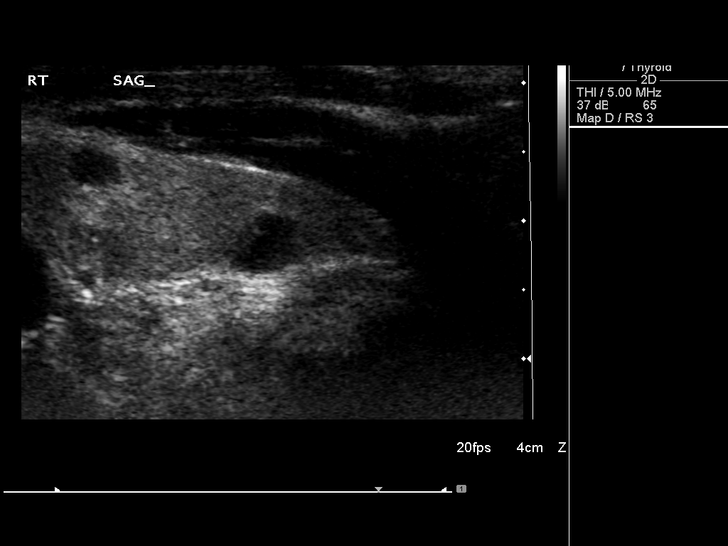
[im 18/48]
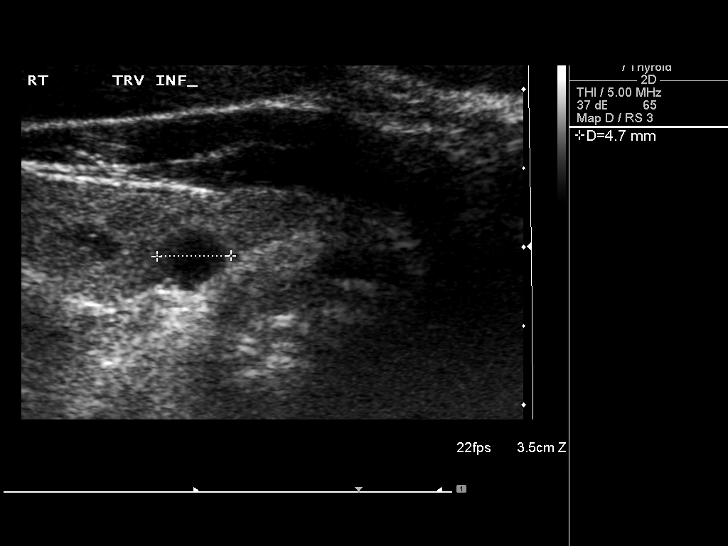
[im 22/48]
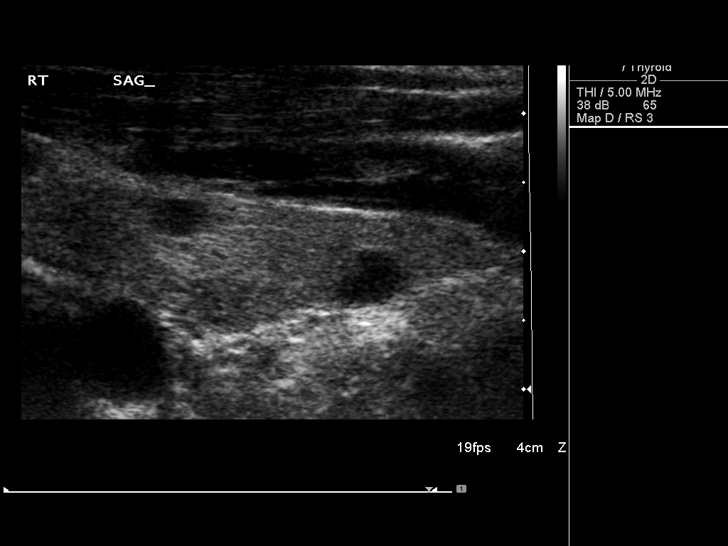
[im 26/48]
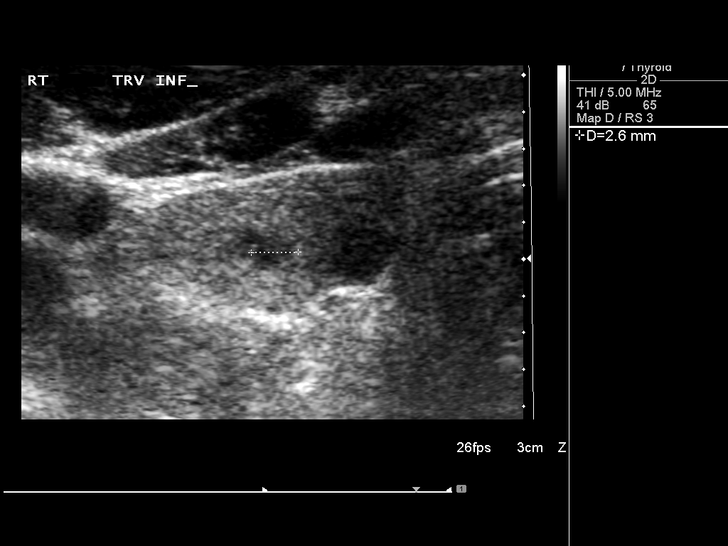
[im 30/48]
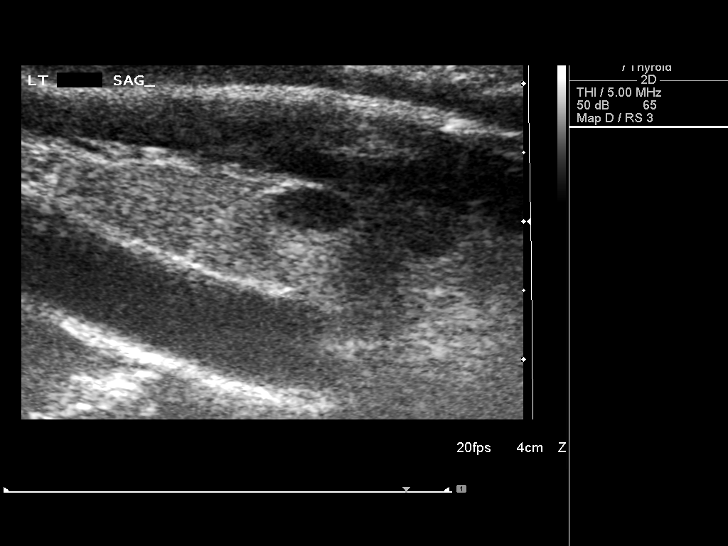
[im 32/48]
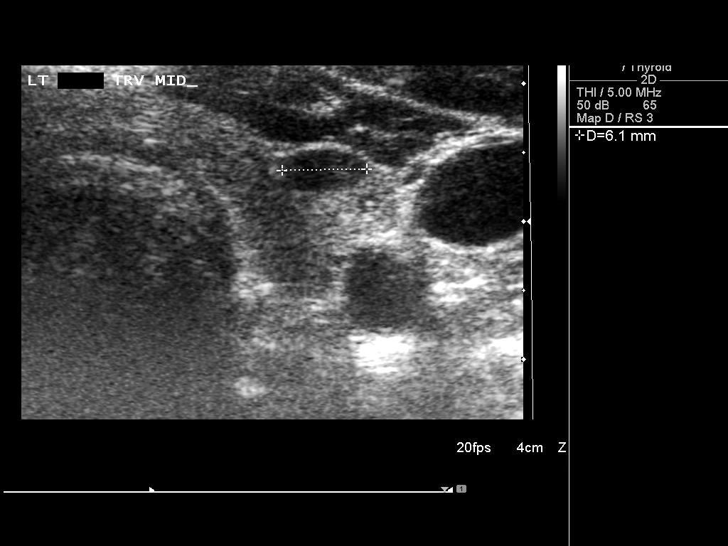
[im 36/48]
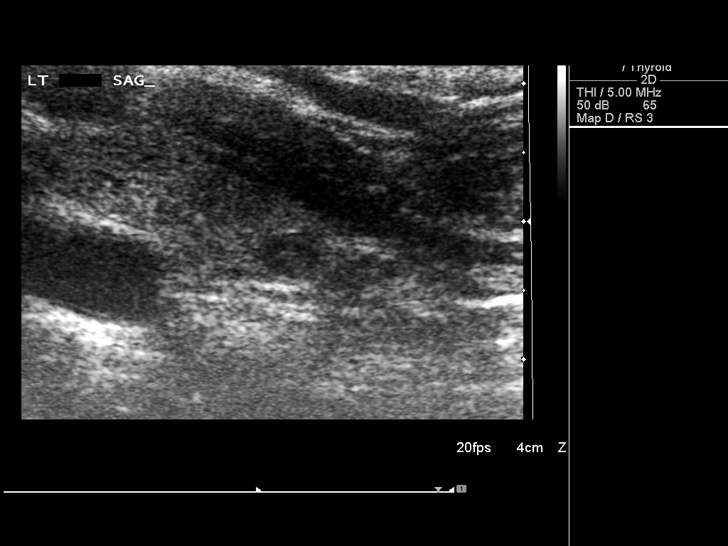
[im 40/48]
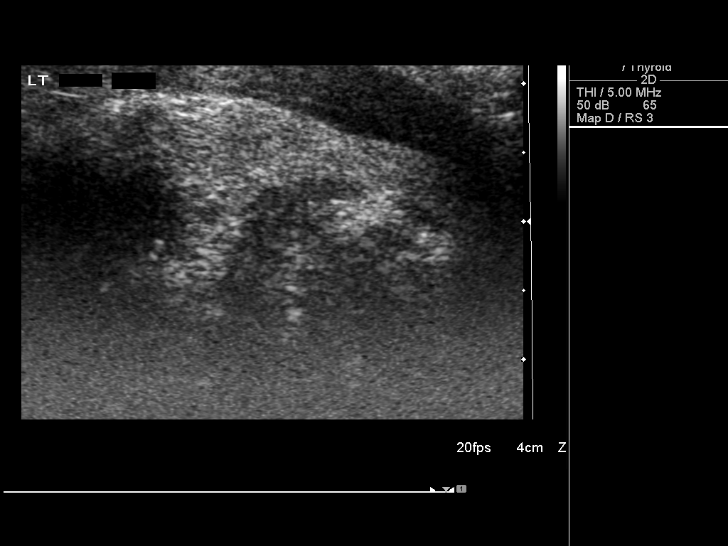
[im 44/48]
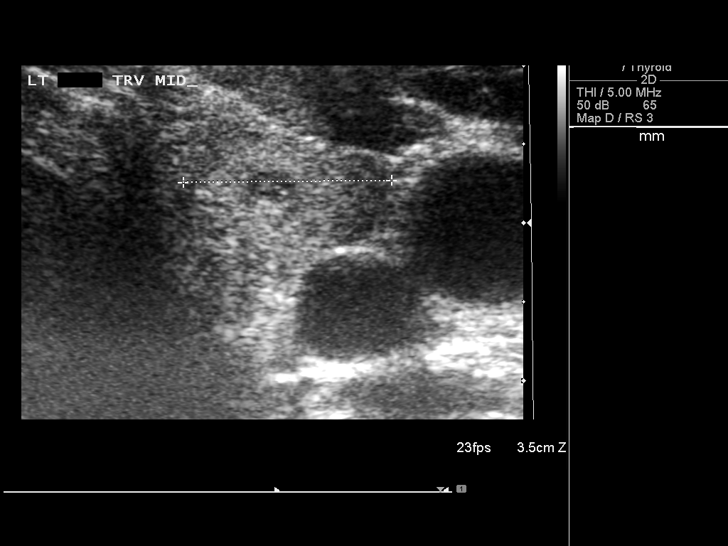
[im 48/48]
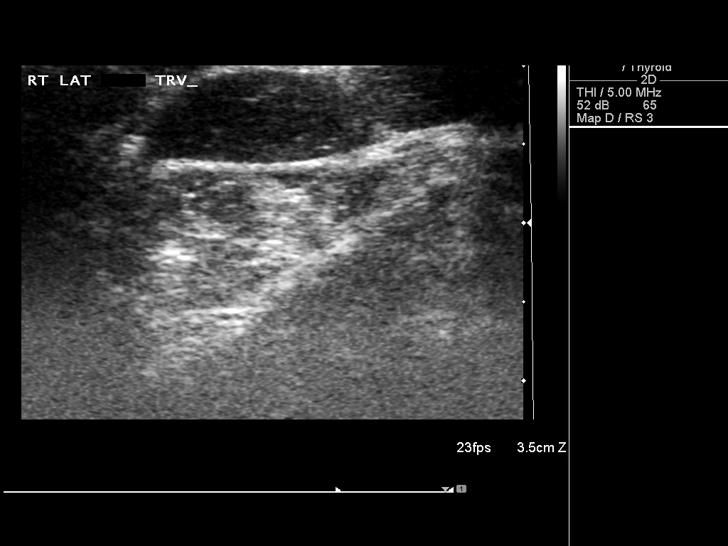

[14 of 25 positions shown; findings below may reference images not displayed]

FINDINGS: Right thyroid lobe

Measurements: 3.9 x 1.3 x 1.4 cm (stable size).

Stable cystic nodules and partially solid and partially cystic
nodules all measuring 0.5 cm or less in diameter. No echogenic foci.
Surrounding parenchyma is heterogeneous.

Left thyroid lobe

Measurements: 4.0 x 1.3 x 1.3 cm (stable size).

Stable predominantly cystic hypoechoic nodules scattered throughout
the left lobe with the largest measuring 0.6 cm in greatest
diameter. Surrounding parenchyma is heterogeneous.

Isthmus

Thickness: 0.3 cm.

No nodules visualized.

Lymphadenopathy

None visualized.
IMPRESSION: Stable multinodular goiter with scattered small bilateral
subcentimeter nodules which are predominantly cystic.

## 2017-01-28 DIAGNOSIS — I10 Essential (primary) hypertension: Secondary | ICD-10-CM | POA: Diagnosis not present

## 2017-01-28 DIAGNOSIS — Z7689 Persons encountering health services in other specified circumstances: Secondary | ICD-10-CM | POA: Diagnosis not present

## 2017-01-28 DIAGNOSIS — K219 Gastro-esophageal reflux disease without esophagitis: Secondary | ICD-10-CM | POA: Diagnosis not present

## 2017-05-15 DIAGNOSIS — I10 Essential (primary) hypertension: Secondary | ICD-10-CM | POA: Diagnosis not present

## 2017-05-15 DIAGNOSIS — J0140 Acute pansinusitis, unspecified: Secondary | ICD-10-CM | POA: Diagnosis not present

## 2017-05-15 DIAGNOSIS — E049 Nontoxic goiter, unspecified: Secondary | ICD-10-CM | POA: Diagnosis not present

## 2017-06-12 DIAGNOSIS — H524 Presbyopia: Secondary | ICD-10-CM | POA: Diagnosis not present

## 2017-06-12 DIAGNOSIS — H35033 Hypertensive retinopathy, bilateral: Secondary | ICD-10-CM | POA: Diagnosis not present

## 2017-06-12 DIAGNOSIS — H04123 Dry eye syndrome of bilateral lacrimal glands: Secondary | ICD-10-CM | POA: Diagnosis not present

## 2017-06-12 DIAGNOSIS — I10 Essential (primary) hypertension: Secondary | ICD-10-CM | POA: Diagnosis not present

## 2017-06-12 DIAGNOSIS — H25813 Combined forms of age-related cataract, bilateral: Secondary | ICD-10-CM | POA: Diagnosis not present

## 2017-06-12 DIAGNOSIS — H52223 Regular astigmatism, bilateral: Secondary | ICD-10-CM | POA: Diagnosis not present

## 2017-06-12 DIAGNOSIS — H5213 Myopia, bilateral: Secondary | ICD-10-CM | POA: Diagnosis not present

## 2017-06-26 DIAGNOSIS — E042 Nontoxic multinodular goiter: Secondary | ICD-10-CM | POA: Diagnosis not present

## 2017-08-20 NOTE — Progress Notes (Signed)
Patient ID: Jamie Jensen, female   DOB: 08-14-1945, 73 y.o.   MRN: [id number]   72 y.o. history of HTN and obesity. Chronic issues with "syncope" thought to be vagal and non cardiac in etiology. Goiter with normal TSH and [place] mostly cystic. Normal stress echo in 2014. ECG with chronic LAD and poor R wave progression.Previous atypical chest pain. Health fair screening told she had poor circulation but LE pulses have been normal   Has Floaters and Dr Sabra Heck thought circulation in left eye worse ?? No other TIA symptoms Having marital issues with Roland one of my patients as well  ROS: Denies fever, malais, weight loss, blurry vision, decreased visual acuity, cough, sputum, SOB, hemoptysis, pleuritic pain, palpitaitons, heartburn, abdominal pain, melena, lower extremity edema, claudication, or rash.  All other systems reviewed and negative  General: BP 134/80   Pulse 71   Ht 5' 1.5" (1.562 m)   Wt 182 lb 12 oz (82.9 kg)   SpO2 97%   BMI 33.97 kg/m  Affect appropriate Healthy:  appears stated age 81: normal Neck supple with no adenopathy JVP normal no bruits no thyromegaly Lungs clear with no wheezing and good diaphragmatic motion Heart:  S1/S2 no murmur, no rub, gallop or click PMI normal Abdomen: benighn, BS positve, no tenderness, no AAA no bruit.  No HSM or HJR Distal pulses intact with no bruits No edema Neuro non-focal Skin warm and dry No muscular weakness    Current Outpatient Medications  Medication Sig Dispense Refill  . triamterene-hydrochlorothiazide (DYAZIDE) 37.5-25 MG per capsule Take 1 capsule by mouth every morning.     No current facility-administered medications for this visit.     Allergies  Latex  Electrocardiogram:  SR rate 75  Normal ECG 07/30/13  NSR normal no change  09/25/15  SR rate 61 LAD low voltage 08/21/16  SR rate 41 RSR' LAD 08/22/17 NSR rate 71 LAD normal otherwsie   Assessment and Plan Chest Pain:  Resolved previously normal stress echo  stable GERD:  On prilosec stable low carb diet Goiter:  Seeing Dr Soyla Murphy TSH normal US done 11/28/15 mostly cystic  HTN:  Low sodium DASH diet continue diuretic ECG:  Stable no changes LAD poor R wave progression Circulation normal by exam and no symptoms no need for f/u ABI's  Syncope:  vasavagal no cardiac etiology determined  Optho:  Dr Sabra Heck concerned about circulation to left eye will order carotid duplex To r/o plaque doubt issue no bruit   [name]

## 2017-08-22 ENCOUNTER — Encounter: Payer: Self-pay | Admitting: Cardiovascular Disease

## 2017-08-22 ENCOUNTER — Ambulatory Visit (INDEPENDENT_AMBULATORY_CARE_PROVIDER_SITE_OTHER): Payer: Medicare Other | Admitting: Cardiovascular Disease

## 2017-08-22 VITALS — BP 134/80 | HR 71 | Ht 61.5 in | Wt 182.8 lb

## 2017-08-22 DIAGNOSIS — I10 Essential (primary) hypertension: Secondary | ICD-10-CM

## 2017-08-22 DIAGNOSIS — Z09 Encounter for follow-up examination after completed treatment for conditions other than malignant neoplasm: Secondary | ICD-10-CM | POA: Diagnosis not present

## 2017-08-22 DIAGNOSIS — H539 Unspecified visual disturbance: Secondary | ICD-10-CM

## 2017-08-22 DIAGNOSIS — R0789 Other chest pain: Secondary | ICD-10-CM

## 2017-08-22 NOTE — Patient Instructions (Addendum)
Medication Instructions:  Your physician recommends that you continue on your current medications as directed. Please refer to the Current Medication list given to you today.  Labwork: NONE  Testing/Procedures: Your physician has requested that you have a carotid duplex. This test is an ultrasound of the carotid arteries in your neck. It looks at blood flow through these arteries that supply the brain with blood. Allow one hour for this exam. There are no restrictions or special instructions.  Follow-Up: Your physician wants you to follow-up as needed with Dr. Nishan.   If you need a refill on your cardiac medications before your next appointment, please call your pharmacy.    

## 2017-08-25 ENCOUNTER — Other Ambulatory Visit: Payer: Self-pay | Admitting: Cardiovascular Disease

## 2017-08-25 DIAGNOSIS — H539 Unspecified visual disturbance: Secondary | ICD-10-CM

## 2017-08-26 ENCOUNTER — Ambulatory Visit (HOSPITAL_COMMUNITY)
Admission: RE | Admit: 2017-08-26 | Discharge: 2017-08-26 | Disposition: A | Discharge status: Home / Self Care | Payer: Medicare Other | Source: Ambulatory Visit | Attending: Cardiology | Admitting: Cardiology

## 2017-08-26 DIAGNOSIS — H539 Unspecified visual disturbance: Secondary | ICD-10-CM

## 2017-09-03 DIAGNOSIS — Z01419 Encounter for gynecological examination (general) (routine) without abnormal findings: Secondary | ICD-10-CM | POA: Diagnosis not present

## 2017-09-03 DIAGNOSIS — Z1231 Encounter for screening mammogram for malignant neoplasm of breast: Secondary | ICD-10-CM | POA: Diagnosis not present

## 2017-09-03 DIAGNOSIS — Z808 Family history of malignant neoplasm of other organs or systems: Secondary | ICD-10-CM | POA: Diagnosis not present

## 2017-09-03 DIAGNOSIS — Z6834 Body mass index (BMI) 34.0-34.9, adult: Secondary | ICD-10-CM | POA: Diagnosis not present

## 2017-09-03 DIAGNOSIS — Z801 Family history of malignant neoplasm of trachea, bronchus and lung: Secondary | ICD-10-CM | POA: Diagnosis not present

## 2017-10-14 DIAGNOSIS — J209 Acute bronchitis, unspecified: Secondary | ICD-10-CM | POA: Diagnosis not present

## 2017-10-14 DIAGNOSIS — J029 Acute pharyngitis, unspecified: Secondary | ICD-10-CM | POA: Diagnosis not present

## 2017-10-14 DIAGNOSIS — R05 Cough: Secondary | ICD-10-CM | POA: Diagnosis not present

## 2017-10-30 DIAGNOSIS — J9801 Acute bronchospasm: Secondary | ICD-10-CM | POA: Diagnosis not present

## 2017-10-30 DIAGNOSIS — J189 Pneumonia, unspecified organism: Secondary | ICD-10-CM | POA: Diagnosis not present

## 2017-10-30 DIAGNOSIS — J069 Acute upper respiratory infection, unspecified: Secondary | ICD-10-CM | POA: Diagnosis not present

## 2017-12-27 DIAGNOSIS — M25572 Pain in left ankle and joints of left foot: Secondary | ICD-10-CM | POA: Diagnosis not present

## 2017-12-27 DIAGNOSIS — M79672 Pain in left foot: Secondary | ICD-10-CM | POA: Diagnosis not present

## 2018-01-15 DIAGNOSIS — J069 Acute upper respiratory infection, unspecified: Secondary | ICD-10-CM | POA: Diagnosis not present

## 2018-01-15 DIAGNOSIS — J9801 Acute bronchospasm: Secondary | ICD-10-CM | POA: Diagnosis not present

## 2018-03-30 DIAGNOSIS — H1045 Other chronic allergic conjunctivitis: Secondary | ICD-10-CM | POA: Diagnosis not present

## 2018-05-21 ENCOUNTER — Ambulatory Visit (INDEPENDENT_AMBULATORY_CARE_PROVIDER_SITE_OTHER): Payer: Medicare Other | Admitting: Gastroenterology

## 2018-05-21 ENCOUNTER — Encounter: Payer: Self-pay | Admitting: Gastroenterology

## 2018-05-21 VITALS — BP 132/88 | HR 88 | Ht 61.5 in | Wt 185.2 lb

## 2018-05-21 DIAGNOSIS — R1031 Right lower quadrant pain: Secondary | ICD-10-CM | POA: Diagnosis not present

## 2018-05-21 DIAGNOSIS — Z860100 Personal history of colon polyps, unspecified: Secondary | ICD-10-CM

## 2018-05-21 DIAGNOSIS — K5730 Diverticulosis of large intestine without perforation or abscess without bleeding: Secondary | ICD-10-CM

## 2018-05-21 NOTE — Progress Notes (Addendum)
Referring Provider: Wayland Salinas, * Primary Care Physician:  Wayland Salinas, MD   Reason for Consultation: Right lower quadrant pain   IMPRESSION:  Acute, intermittent RLQ pain with malodorous flatus Chronically alternating bowel habits Pelvic adhesions    - history of  severe endometriosis necessitating multiple surgeries >15 years ago    - 2 C-sections, a vaginal hysterectomy and resection of endometriomas    - postoperative complication of perforated bladder  necessitating a suprapubic catheter and subsequent reoperation  History of anal fissure History of colon polyp    - cecal tubular adenoma 2016 Olevia Perches)    - surveillance colonoscopy due 2021 Cholecystectomy for cholelithiasis 1998 Mild sigmoid diverticulosis  Acute intermittent abdominal pain with chronically alternating bowel habits. No alarm features.  By description, sounds like adhesive disease. Must consider associated bacterial overgrowth. She would like to avoid prescription medications if possible.   PLAN: Daily stool bulking agent (Metamucil, Benefiber, Citrucel) Discussed low residual, high fiber diet Trial of daily probiotics (Align coupon provided) Return to this clinic in 3 months if symptoms are not improving Surveillance colonoscopy due 2021  HPI: Jamie Jensen is a 73 y.o. female seen in consultation for abdominal pain.  The patient was previously seen by Dr. Olevia Perches.  She was seen by Ms. Lemon and supervised by Dr. Loletha Carrow in 2017 for left upper quadrant pain, nausea, and diarrhea.  The history is obtained through the patient and review of her electronic health record. She has a history of an anal fissure. There is a history of severe endometriosis necessitating multiple surgeries. She has had 2 C-sections, a vaginal hysterectomy and resection of endometriomas in [place] approximately 20 years ago. She had a postoperative complication of perforated bladder which necessitated a suprapubic  catheter and subsequent reoperation. She was told to have pelvic adhesions.  Her last colonoscopy 11/09/14 revealed a cecal tubular adenoma, mild diverticulosis throughout the entire examined colon and small internal hemorrhoids.  Surveillance colonoscopy recommended in 5 years.  EGD 03/31/13 revealed grade 2 esophagitis and antral gastritis with erosions and small superficial ulceration. Pathology revealed chronically inflamed squamous and cardiac type mucosa in the GE junction.  Biopsies were negative for H. Pylori.  Evaluation in 2016 included CT of the abdomen and pelvis with contrast 11/18/2014 showed no acute abnormalities.  There was a 9 mm hepatic cyst as well as some tiny cyst located in the right lobe and caudate of the liver.  Mild diverticulosis noted.  In 2017 she reported ongoing use of pantoprazole 40 mg daily since the time of her EGD in 2014.  When her primary care doctor switched this to ranitidine 150 mg daily her symptoms of reflux and left sided abdominal pain recurred.  After reviewing the long-term risks of PPI therapy, she decided to resume pantoprazole 40 mg daily.   Over Christmas she attended a family meeting. Severe, stabbing pain last 3 days and disrupted her trip. Associated bloating and obstipation.  Took GasEx, increased fluid intake, suppositories, anything to help with constipation. Trying to clean out her GI track. She was worried because of the volume of nuts she ate.  She stopped eating solids foods.  On clear liquids and suppositories her constipation improved. However, she still feels poorly. Now with diarrhea. Severe malodorous flatus that has change over Christmas, as well. She feels like it smells "really sick."  Avoiding all raw vegetables. Not starting her morning with coffee to avoid the acidity and will wait until she has eaten something.  Symptoms exacerbated by milk. She doesn't want to take medications.   Fissure was exacerbated with constipation over the  holidays. Responding to "cream" with decreasing frequency. Does not feel this is a problem now.  Does not tolerate raw food which she attributes to her cholecystectomy. Chronically altering between diarrhea and constipation.   Weight stable. Energy okay. Some stress surrounding her husband's need for intense medical care recently, now with an upcoming knee surgery.    Quality: Amount: Duration: Timing: Progression: Chronicity: Context: Similar prior episodes: Relieved by: Worsened by: Effective treatments: Ineffective treatments: Associated symptoms: Risks factors:   Past Medical History:  Diagnosis Date  . Anal fissure   . Anemia    in the past with surgery but no transfusion  . CELLULITIS/ABSCESS NOS 07/04/2009  . Chronic sore throat   . Diverticulosis   . Endometriosis   . Gallstones   . Hx of gastric ulcer   . Hypertension   . OSTEOPOROSIS 07/03/2007  . Other diseases of nasal cavity and sinuses(478.19) 06/02/2009  . Snores     Past Surgical History:  Procedure Laterality Date  . BLADDER REPAIR  1993   post perforation during surgery for endometriosis  . BREAST BIOPSY  [id number]   rt and lt-negative  . CESAREAN SECTION     x 2  . CHOLECYSTECTOMY  2000  . COLONOSCOPY  09-28-2004   tics only  . complete hysterectomy  1985   following partial hysterectomy  . DILATION AND CURETTAGE OF UTERUS    . LAPAROSCOPIC ENDOMETRIOSIS FULGURATION  1993   with subsequent bladder perforation  . PARTIAL HYSTERECTOMY    . ROTATOR CUFF REPAIR Right   . SHOULDER ARTHROSCOPY  2010   right  . TONSILLECTOMY AND ADENOIDECTOMY Bilateral 08/09/2013   Procedure: BILATERAL TONSILLECTOMY ;  Surgeon: Ascencion Dike, MD;  Location: [facility];  Service: ENT;  Laterality: Bilateral;    Current Outpatient Medications  Medication Sig Dispense Refill  . triamterene-hydrochlorothiazide (DYAZIDE) 37.5-25 MG per capsule Take 1 capsule by mouth every morning.     No current  facility-administered medications for this visit.     Allergies as of 05/21/2018 - Review Complete 05/21/2018  Allergen Reaction Noted  . Latex Other (See Comments) 10/26/2014    Family History  Problem Relation Age of Onset  . Pancreatic cancer Mother   . Kidney cancer Mother   . Heart disease Father   . Lung cancer Father        smoker  . Prostate cancer Other   . Breast cancer Maternal Aunt        x 2  . Colon cancer Neg Hx   . Rectal cancer Neg Hx   . Stomach cancer Neg Hx     Social History   Socioeconomic History  . Marital status: Married    Spouse name: Not on file  . Number of children: 2  . Years of education: Not on file  . Highest education level: Not on file  Occupational History  . Occupation: semi-retired  Social Needs  . Financial resource strain: Not on file  . Food insecurity:    Worry: Not on file    Inability: Not on file  . Transportation needs:    Medical: Not on file    Non-medical: Not on file  Tobacco Use  . Smoking status: Never Smoker  . Smokeless tobacco: Never Used  Substance and Sexual Activity  . Alcohol use: Yes    Alcohol/week: 0.0 standard drinks  Comment: occasionaly  . Drug use: No  . Sexual activity: Not on file  Lifestyle  . Physical activity:    Days per week: Not on file    Minutes per session: Not on file  . Stress: Not on file  Relationships  . Social connections:    Talks on phone: Not on file    Gets together: Not on file    Attends religious service: Not on file    Active member of club or organization: Not on file    Attends meetings of clubs or organizations: Not on file    Relationship status: Not on file  . Intimate partner violence:    Fear of current or ex partner: Not on file    Emotionally abused: Not on file    Physically abused: Not on file    Forced sexual activity: Not on file  Other Topics Concern  . Not on file  Social History Narrative  . Not on file    Review of Systems: 12 system  ROS is negative except as noted above.  Filed Weights   05/21/18 0842  Weight: 185 lb 4 oz (84 kg)    Physical Exam: Vital signs were reviewed. General:   Alert, well-nourished, pleasant and cooperative in NAD Head:  Normocephalic and atraumatic. Eyes:  Sclera clear, no icterus.   Conjunctiva pink. Mouth:  No deformity or lesions.   Neck:  Supple; no thyromegaly. Lungs:  Clear throughout to auscultation.   No wheezes.  Heart:  Regular rate and rhythm; no murmurs Abdomen:  Soft, nontender, well healed hysterectomy scar without focal tenderness, normal bowel sounds. No rebound or guarding. No hepatosplenomegaly Rectal:  Deferred  Msk:  Symmetrical without gross deformities. Extremities:  No gross deformities or edema. Neurologic:  Alert and  oriented x4;  grossly nonfocal Skin:  No rash or bruise. Psych:  Alert and cooperative. Normal mood and affect.   Dareth Andrew L. Tarri Glenn, MD, MPH [facility] 05/21/2018, 9:57 AM

## 2018-05-21 NOTE — Patient Instructions (Signed)
Daily stool bulking agent recommended (Metamucil, Benefiber, Citrucel, or generic version) Trial of daily probiotics (Align coupon provided) Return to this clinic in 3 months if symptoms are not improving

## 2019-07-16 ENCOUNTER — Other Ambulatory Visit: Payer: Self-pay | Admitting: Orthopedic Surgery

## 2019-07-16 DIAGNOSIS — M25561 Pain in right knee: Secondary | ICD-10-CM

## 2019-07-28 ENCOUNTER — Ambulatory Visit
Admission: RE | Admit: 2019-07-28 | Discharge: 2019-07-28 | Disposition: A | Discharge status: Home / Self Care | Payer: Medicare Other | Source: Ambulatory Visit | Attending: Orthopedic Surgery | Admitting: Orthopedic Surgery

## 2019-07-28 ENCOUNTER — Other Ambulatory Visit: Payer: Self-pay

## 2019-07-28 DIAGNOSIS — M25561 Pain in right knee: Secondary | ICD-10-CM

## 2019-09-14 ENCOUNTER — Encounter: Payer: Self-pay | Admitting: Gastroenterology

## 2019-10-02 ENCOUNTER — Encounter (HOSPITAL_COMMUNITY): Payer: Self-pay | Admitting: Emergency Medicine

## 2019-10-02 ENCOUNTER — Other Ambulatory Visit: Payer: Self-pay

## 2019-10-02 ENCOUNTER — Emergency Department (HOSPITAL_COMMUNITY): Payer: Medicare Other

## 2019-10-02 ENCOUNTER — Inpatient Hospital Stay (HOSPITAL_COMMUNITY)
Admission: EM | Admit: 2019-10-02 | Discharge: 2019-10-04 | DRG: 494 | Disposition: A | Discharge status: Home Health | Payer: Medicare Other | Attending: Orthopedic Surgery | Admitting: Orthopedic Surgery

## 2019-10-02 DIAGNOSIS — S82842A Displaced bimalleolar fracture of left lower leg, initial encounter for closed fracture: Secondary | ICD-10-CM | POA: Diagnosis not present

## 2019-10-02 DIAGNOSIS — Z91040 Latex allergy status: Secondary | ICD-10-CM

## 2019-10-02 DIAGNOSIS — Z79899 Other long term (current) drug therapy: Secondary | ICD-10-CM

## 2019-10-02 DIAGNOSIS — I10 Essential (primary) hypertension: Secondary | ICD-10-CM | POA: Diagnosis present

## 2019-10-02 DIAGNOSIS — S82892A Other fracture of left lower leg, initial encounter for closed fracture: Secondary | ICD-10-CM | POA: Diagnosis present

## 2019-10-02 DIAGNOSIS — S9305XA Dislocation of left ankle joint, initial encounter: Secondary | ICD-10-CM

## 2019-10-02 DIAGNOSIS — Z8249 Family history of ischemic heart disease and other diseases of the circulatory system: Secondary | ICD-10-CM

## 2019-10-02 DIAGNOSIS — S82832A Other fracture of upper and lower end of left fibula, initial encounter for closed fracture: Secondary | ICD-10-CM

## 2019-10-02 DIAGNOSIS — Z20822 Contact with and (suspected) exposure to covid-19: Secondary | ICD-10-CM | POA: Diagnosis present

## 2019-10-02 DIAGNOSIS — S93432A Sprain of tibiofibular ligament of left ankle, initial encounter: Secondary | ICD-10-CM | POA: Diagnosis not present

## 2019-10-02 DIAGNOSIS — Q899 Congenital malformation, unspecified: Secondary | ICD-10-CM

## 2019-10-02 DIAGNOSIS — W19XXXA Unspecified fall, initial encounter: Secondary | ICD-10-CM | POA: Diagnosis present

## 2019-10-02 DIAGNOSIS — M810 Age-related osteoporosis without current pathological fracture: Secondary | ICD-10-CM | POA: Diagnosis present

## 2019-10-02 LAB — BASIC METABOLIC PANEL
Anion gap: 11 (ref 5–15)
BUN: 12 mg/dL (ref 8–23)
CO2: 24 mmol/L (ref 22–32)
Calcium: 9 mg/dL (ref 8.9–10.3)
Chloride: 106 mmol/L (ref 98–111)
Creatinine, Ser: 0.87 mg/dL (ref 0.44–1.00)
GFR calc Af Amer: >60 mL/min (ref >60)
GFR calc non Af Amer: >60 mL/min (ref >60)
Glucose, Bld: 127 mg/dL — ABNORMAL HIGH (ref 70–99)
Potassium: 4.1 mmol/L (ref 3.5–5.1)
Sodium: 141 mmol/L (ref 135–145)

## 2019-10-02 LAB — CBC WITH DIFFERENTIAL/PLATELET
Abs Immature Granulocytes: 0.05 10*3/uL (ref 0.00–0.07)
Basophils Absolute: 0.1 10*3/uL (ref 0.0–0.1)
Basophils Relative: 1 %
Eosinophils Absolute: 0 10*3/uL (ref 0.0–0.5)
Eosinophils Relative: 0 %
HCT: 41.5 % (ref 36.0–46.0)
Hemoglobin: 13.9 g/dL (ref 12.0–15.0)
Immature Granulocytes: 1 %
Lymphocytes Relative: 8 %
Lymphs Abs: 0.9 10*3/uL (ref 0.7–4.0)
MCH: 30.7 pg (ref 26.0–34.0)
MCHC: 33.5 g/dL (ref 30.0–36.0)
MCV: 91.6 fL (ref 80.0–100.0)
Monocytes Absolute: 0.5 10*3/uL (ref 0.1–1.0)
Monocytes Relative: 5 %
Neutro Abs: 9.2 10*3/uL — ABNORMAL HIGH (ref 1.7–7.7)
Neutrophils Relative %: 85 %
Platelets: 270 10*3/uL (ref 150–400)
RBC: 4.53 MIL/uL (ref 3.87–5.11)
RDW: 12.7 % (ref 11.5–15.5)
WBC: 10.7 10*3/uL — ABNORMAL HIGH (ref 4.0–10.5)
nRBC: 0 % (ref 0.0–0.2)

## 2019-10-02 LAB — SURGICAL PCR SCREEN
MRSA, PCR: NEGATIVE
Staphylococcus aureus: NEGATIVE

## 2019-10-02 LAB — SARS CORONAVIRUS 2 BY RT PCR (HOSPITAL ORDER, PERFORMED IN ~~LOC~~ HOSPITAL LAB): SARS Coronavirus 2: NEGATIVE

## 2019-10-02 MED ORDER — FENTANYL CITRATE (PF) 100 MCG/2ML IJ SOLN
25.0000 ug | Freq: Once | INTRAMUSCULAR | Status: AC
  Administered 2019-10-02: 25 ug via INTRAVENOUS
  Filled 2019-10-02: qty 2

## 2019-10-02 MED ORDER — PROPOFOL 10 MG/ML IV BOLUS
0.5000 mg/kg | Freq: Once | INTRAVENOUS | Status: AC
  Administered 2019-10-03: 150 mg via INTRAVENOUS
  Filled 2019-10-02: qty 20

## 2019-10-02 MED ORDER — ASPIRIN EC 81 MG PO TBEC: 81.0000 mg | DELAYED_RELEASE_TABLET | Freq: Every day | ORAL | Status: DC

## 2019-10-02 MED ORDER — HYDROMORPHONE HCL 1 MG/ML IJ SOLN
0.5000 mg | INTRAMUSCULAR | Status: DC | PRN
  Administered 2019-10-02: 1 mg via INTRAVENOUS
  Filled 2019-10-02: qty 1

## 2019-10-02 MED ORDER — PROPOFOL 10 MG/ML IV BOLUS
INTRAVENOUS | Status: AC | PRN
  Administered 2019-10-02: 40 mg via INTRAVENOUS

## 2019-10-02 MED ORDER — HYDROMORPHONE HCL 1 MG/ML IJ SOLN
0.5000 mg | Freq: Once | INTRAMUSCULAR | Status: AC
  Administered 2019-10-02: 0.5 mg via INTRAVENOUS

## 2019-10-02 MED ORDER — METHOCARBAMOL 1000 MG/10ML IJ SOLN
500.0000 mg | Freq: Four times a day (QID) | INTRAVENOUS | Status: DC | PRN
  Filled 2019-10-02: qty 5

## 2019-10-02 MED ORDER — HYDROMORPHONE HCL 1 MG/ML IJ SOLN
INTRAMUSCULAR | Status: AC
  Filled 2019-10-02: qty 1

## 2019-10-02 MED ORDER — OXYCODONE HCL 5 MG PO TABS: 5.0000 mg | ORAL_TABLET | ORAL | Status: DC | PRN

## 2019-10-02 MED ORDER — ONDANSETRON HCL 4 MG/2ML IJ SOLN
4.0000 mg | Freq: Once | INTRAMUSCULAR | Status: AC
  Administered 2019-10-02: 4 mg via INTRAVENOUS
  Filled 2019-10-02: qty 2

## 2019-10-02 NOTE — Progress Notes (Signed)
Orthopedic Tech Progress Note Patient Details:  Jamie Jensen 1945-10-01 [id number]  Ortho Devices Type of Ortho Device: Post (long) splint, Stirrup splint Splint Material: Fiberglass Ortho Device/Splint Location: LLE Ortho Device/Splint Interventions: Application, Ordered   Post Interventions Patient Tolerated: Well Instructions Provided: Other (comment)   [name] 10/02/2019, 1:28 PM

## 2019-10-02 NOTE — Anesthesia Preprocedure Evaluation (Addendum)
Anesthesia Evaluation  Patient identified by MRN, date of birth, ID band Patient awake    Reviewed: Allergy & Precautions, NPO status , Patient's Chart, lab work & pertinent test results  History of Anesthesia Complications (+) AWARENESS UNDER ANESTHESIANegative for: history of anesthetic complications  Airway Mallampati: II  TM Distance: >3 FB Neck ROM: Full    Dental no notable dental hx. (+) Dental Advisory Given   Pulmonary neg pulmonary ROS,    Pulmonary exam normal        Cardiovascular hypertension, Pt. on medications Normal cardiovascular exam     Neuro/Psych negative neurological ROS     GI/Hepatic negative GI ROS, Neg liver ROS,   Endo/Other  negative endocrine ROS  Renal/GU negative Renal ROS     Musculoskeletal negative musculoskeletal ROS (+)   Abdominal   Peds  Hematology negative hematology ROS (+)   Anesthesia Other Findings   Reproductive/Obstetrics                           Anesthesia Physical Anesthesia Plan  ASA: II  Anesthesia Plan: General   Post-op Pain Management: GA combined w/ Regional for post-op pain   Induction:   PONV Risk Score and Plan: 3 and Ondansetron, Dexamethasone and Midazolam  Airway Management Planned: LMA  Additional Equipment:   Intra-op Plan:   Post-operative Plan: Extubation in OR  Informed Consent: I have reviewed the patients History and Physical, chart, labs and discussed the procedure including the risks, benefits and alternatives for the proposed anesthesia with the patient or authorized representative who has indicated his/her understanding and acceptance.     Dental advisory given  Plan Discussed with: CRNA and Anesthesiologist  Anesthesia Plan Comments:        Anesthesia Quick Evaluation

## 2019-10-02 NOTE — ED Provider Notes (Signed)
[facility] EMERGENCY DEPARTMENT Provider Note   CSN: [id number] Arrival date & time: 10/02/19  [id number]     History Chief Complaint  Patient presents with  . Fall    Jamie Jensen is a 74 y.o. female.  HPI HPI Comments: Jamie Jensen is a 74 y.o. female who presents to the Emergency Department complaining of a fall.  Patient was packing her car for vacation and was on a patio in her yard.  She felt her left ankle "give" and she fell on the grassy surface of her lawn.  She is not on blood thinners.  She denies head or neck trauma.  She denies any head or neck pain.  No syncope.  She was brought over by EMS who gave her 250 mcg of fentanyl in route.  Her pain is currently 6/10.  She denies numbness, tingling, hip pain, chest pain, shortness of breath, abdominal pain.  She does note some mild nausea at this time.    Past Medical History:  Diagnosis Date  . Anal fissure   . Anemia    in the past with surgery but no transfusion  . CELLULITIS/ABSCESS NOS 07/04/2009  . Chronic sore throat   . Diverticulosis   . Endometriosis   . Gallstones   . Hx of gastric ulcer   . Hypertension   . OSTEOPOROSIS 07/03/2007  . Other diseases of nasal cavity and sinuses(478.19) 06/02/2009  . Snores     Patient Active Problem List   Diagnosis Date Noted  . Syncope 08/07/2012  . Goiter 08/07/2012  . CELLULITIS/ABSCESS NOS 07/04/2009  . OTHER DISEASES OF NASAL CAVITY AND SINUSES 06/02/2009  . OSTEOPOROSIS 07/03/2007    Past Surgical History:  Procedure Laterality Date  . BLADDER REPAIR  1993   post perforation during surgery for endometriosis  . BREAST BIOPSY  [id number]   rt and lt-negative  . CESAREAN SECTION     x 2  . CHOLECYSTECTOMY  2000  . COLONOSCOPY  09-28-2004   tics only  . complete hysterectomy  1985   following partial hysterectomy  . DILATION AND CURETTAGE OF UTERUS    . LAPAROSCOPIC ENDOMETRIOSIS FULGURATION  1993   with subsequent bladder perforation  .  PARTIAL HYSTERECTOMY    . ROTATOR CUFF REPAIR Right   . SHOULDER ARTHROSCOPY  2010   right  . TONSILLECTOMY AND ADENOIDECTOMY Bilateral 08/09/2013   Procedure: BILATERAL TONSILLECTOMY ;  Surgeon: Ascencion Dike, MD;  Location: [facility];  Service: ENT;  Laterality: Bilateral;     OB History   No obstetric history on file.     Family History  Problem Relation Age of Onset  . Pancreatic cancer Mother   . Kidney cancer Mother   . Heart disease Father   . Lung cancer Father        smoker  . Prostate cancer Other   . Breast cancer Maternal Aunt        x 2  . Colon cancer Neg Hx   . Rectal cancer Neg Hx   . Stomach cancer Neg Hx     Social History   Tobacco Use  . Smoking status: Never Smoker  . Smokeless tobacco: Never Used  Substance Use Topics  . Alcohol use: Yes    Alcohol/week: 0.0 standard drinks    Comment: occasionaly  . Drug use: No    Home Medications Prior to Admission medications   Medication Sig Start Date End Date Taking? Authorizing Provider  triamterene-hydrochlorothiazide (DYAZIDE) 37.5-25 MG per capsule Take 1 capsule by mouth every morning.    [provider]    Allergies    Latex  Review of Systems   Review of Systems  All other systems reviewed and are negative. Ten systems reviewed and are negative for acute change, except as noted in the HPI.   Physical Exam Updated Vital Signs BP 134/78 (BP Location: Right Arm)   Pulse 86   Temp 97.7 F (36.5 C) (Oral)   Resp 15   Ht 5' (1.524 m)   Wt 79.4 kg   SpO2 94%   BMI 34.18 kg/m   Physical Exam Vitals and nursing note reviewed.  Constitutional:      General: She is not in acute distress.    Appearance: Normal appearance. She is not ill-appearing, toxic-appearing or diaphoretic.  HENT:     Head: Normocephalic and atraumatic.     Comments: No head trauma.    Right Ear: External ear normal.     Left Ear: External ear normal.     Nose: Nose normal.     Mouth/Throat:       Mouth: Mucous membranes are moist.     Pharynx: Oropharynx is clear. No oropharyngeal exudate or posterior oropharyngeal erythema.  Eyes:     Extraocular Movements: Extraocular movements intact.  Neck:     Comments: No midline C, T, L-spine tenderness. Cardiovascular:     Rate and Rhythm: Normal rate and regular rhythm.     Pulses: Normal pulses.     Heart sounds: Normal heart sounds. No murmur. No friction rub. No gallop.   Pulmonary:     Effort: Pulmonary effort is normal. No respiratory distress.     Breath sounds: Normal breath sounds. No stridor. No wheezing, rhonchi or rales.  Abdominal:     General: Abdomen is flat.     Tenderness: There is no abdominal tenderness.  Musculoskeletal:        General: Tenderness, deformity and signs of injury present. No swelling. Normal range of motion.     Cervical back: Normal range of motion and neck supple. No tenderness.     Comments: Obvious deformity noted just proximal to the left ankle.  Unable to assess range of motion.  Exquisite pain circumferentially along this region.  No left knee or hip pain.  Left leg is splinted, thus unable to assess range of motion.  Full range of motion of the right leg.  No midline C, T, L-spine tenderness.  Skin:    General: Skin is warm and dry.  Neurological:     General: No focal deficit present.     Mental Status: She is alert and oriented to person, place, and time.     Comments: Patient able to move all the toes of the left foot spontaneously and without difficulty.  Good cap refill in all the toes of the left foot.  Distal sensation intact in all the toes of the left foot.  Palpable pedal pulses noted bilaterally.  Psychiatric:        Mood and Affect: Mood normal.        Behavior: Behavior normal.    ED Results / Procedures / Treatments   Labs (all labs ordered are listed, but only abnormal results are displayed) Labs Reviewed - No data to display  EKG EKG  Interpretation  Date/Time:  Saturday Oct 02 2019 09:43:20 EDT Ventricular Rate:  86 PR Interval:    QRS Duration: 107 QT Interval:  411 QTC Calculation: 492 R Axis:   -68 Text Interpretation: Sinus rhythm Non-specific ST-t changes Confirmed by Lajean Saver [phone]) on 10/02/2019 9:56:26 AM   Radiology DG Tibia/Fibula Left Port  Result Date: 10/02/2019 CLINICAL DATA:  Pain following fall EXAM: PORTABLE LEFT TIBIA AND FIBULA - 2 VIEW COMPARISON:  None. FINDINGS: Frontal and lateral views were obtained. There is a comminuted fracture of the distal fibular diaphysis with lateral displacement as well as posterior and medial angulation of the distal major fracture fragment with respect to the major proximal fragment. There is ankle mortise disruption with the hindfoot posterior to the tibial plafond and. There is an impaction type injury along the posterior distal tibia. No more proximal fractures are evident. IMPRESSION: Comminuted fracture distal fibular diaphysis with angulation and displacement distally. Impaction type fracture of the posterior aspect of the distal tibia with posterior displacement of the distal fragment. There is ankle mortise disruption with the hindfoot posterior to the distal tibia. There is apparent impaction of the distal tibial plafond posteriorly against the talar dome. Electronically Signed   By: Lowella Grip III M.D.   On: 10/02/2019 11:18   DG Ankle Left Port  Result Date: 10/02/2019 CLINICAL DATA:  Pain following fall EXAM: PORTABLE LEFT ANKLE - 2 VIEW COMPARISON:  None. FINDINGS: Frontal and lateral views obtained. Comminuted fracture of the distal fibular diaphysis noted with lateral displacement as well as posterior and medial angulation of the distal major fracture fragment with SPECT the proximal fragment. There is a fracture along the posterior aspect of the distal tibia. There is impaction of the talar dome along the posterior tibial plafond with ankle mortise  disruption. A focus of calcification medial to the distal tibial diaphysis may represent an avulsion in this area as well. There are small calcaneal spurs. IMPRESSION: Probable comminuted fracture distal fibula. Fracture along the posterior distal tibia. Ankle mortise disruption with the hindfoot posterior to the tibia with impaction of the talar dome against the posterior tibial plafond. Question avulsion along the medial distal tibial metaphysis. Electronically Signed   By: Lowella Grip III M.D.   On: 10/02/2019 11:20    Procedures Procedures (including critical care time)  Medications Ordered in ED Medications  propofol (DIPRIVAN) 10 mg/mL bolus/IV push 39.7 mg (has no administration in time range)  HYDROmorphone (DILAUDID) 1 MG/ML injection (has no administration in time range)  ondansetron (ZOFRAN) injection 4 mg (4 mg Intravenous Given 10/02/19 1007)  fentaNYL (SUBLIMAZE) injection 25 mcg (25 mcg Intravenous Given 10/02/19 1105)  propofol (DIPRIVAN) 10 mg/mL bolus/IV push (40 mg Intravenous Given 10/02/19 1300)  HYDROmorphone (DILAUDID) injection 0.5 mg (0.5 mg Intravenous Given 10/02/19 1309)   ED Course  I have reviewed the triage vital signs and the nursing notes.  Pertinent labs & imaging results that were available during my care of the patient were reviewed by me and considered in my medical decision making (see chart for details).    MDM Rules/Calculators/A&P                      Patient is a 74 year old female that presents status post a fall that occurred prior to arrival.  X-ray of the left lower extremity shows probable comminuted fracture distal fibula. Fracture along the posterior distal tibia. Ankle mortise disruption with the hindfoot posterior to the tibia with impaction of the talar dome against the posterior tibial plafond. Question avulsion along the medial distal tibial metaphysis.  I discussed this patient with Dr.  Sharol Given who is on-call for orthopedic surgery.   He evaluated the patient personally and along with my attending physician Dr. Lajean Saver reduced the fracture in the left lower extremity.  Basic labs were ordered.  COVID-19 test ordered.  Patient will be admitted and will have surgery tomorrow.  Note: Portions of this report may have been transcribed using voice recognition software. Every effort was made to ensure accuracy; however, inadvertent computerized transcription errors may be present.    Final Clinical Impression(s) / ED Diagnoses Final diagnoses:  Deformity  Closed fracture of distal end of left fibula, unspecified fracture morphology, initial encounter    Rx / DC Orders ED Discharge Orders    None       Rayna Sexton, PA-C 10/02/19 1327    Lajean Saver, MD 10/02/19 1411

## 2019-10-02 NOTE — Plan of Care (Signed)

## 2019-10-02 NOTE — H&P (Signed)
ORTHOPAEDIC CONSULTATION  REQUESTING PHYSICIAN: Lajean Saver, MD  Chief Complaint: Fracture dislocation left ankle.  HPI: Jamie Jensen is a 74 y.o. female who presents with fracture dislocation left ankle patient states she slipped on the wet grass.  Patient states that she has previously been active with competitive soccer at age 28.  Patient denies any medical problems states that she does not smoke.  Past Medical History:  Diagnosis Date  . Anal fissure   . Anemia    in the past with surgery but no transfusion  . CELLULITIS/ABSCESS NOS 07/04/2009  . Chronic sore throat   . Diverticulosis   . Endometriosis   . Gallstones   . Hx of gastric ulcer   . Hypertension   . OSTEOPOROSIS 07/03/2007  . Other diseases of nasal cavity and sinuses(478.19) 06/02/2009  . Snores    Past Surgical History:  Procedure Laterality Date  . BLADDER REPAIR  1993   post perforation during surgery for endometriosis  . BREAST BIOPSY  [id number]   rt and lt-negative  . CESAREAN SECTION     x 2  . CHOLECYSTECTOMY  2000  . COLONOSCOPY  09-28-2004   tics only  . complete hysterectomy  1985   following partial hysterectomy  . DILATION AND CURETTAGE OF UTERUS    . LAPAROSCOPIC ENDOMETRIOSIS FULGURATION  1993   with subsequent bladder perforation  . PARTIAL HYSTERECTOMY    . ROTATOR CUFF REPAIR Right   . SHOULDER ARTHROSCOPY  2010   right  . TONSILLECTOMY AND ADENOIDECTOMY Bilateral 08/09/2013   Procedure: BILATERAL TONSILLECTOMY ;  Surgeon: Ascencion Dike, MD;  Location: [facility];  Service: ENT;  Laterality: Bilateral;   Social History   Socioeconomic History  . Marital status: Married    Spouse name: Not on file  . Number of children: 2  . Years of education: Not on file  . Highest education level: Not on file  Occupational History  . Occupation: semi-retired  Tobacco Use  . Smoking status: Never Smoker  . Smokeless tobacco: Never Used  Substance and Sexual Activity  .  Alcohol use: Yes    Alcohol/week: 0.0 standard drinks    Comment: occasionaly  . Drug use: No  . Sexual activity: Not on file  Other Topics Concern  . Not on file  Social History Narrative  . Not on file   Social Determinants of Health   Financial Resource Strain:   . Difficulty of Paying Living Expenses:   Food Insecurity:   . Worried About Charity fundraiser in the Last Year:   . Arboriculturist in the Last Year:   Transportation Needs:   . Film/video editor (Medical):   Marland Kitchen Lack of Transportation (Non-Medical):   Physical Activity:   . Days of Exercise per Week:   . Minutes of Exercise per Session:   Stress:   . Feeling of Stress :   Social Connections:   . Frequency of Communication with Friends and Family:   . Frequency of Social Gatherings with Friends and Family:   . Attends Religious Services:   . Active Member of Clubs or Organizations:   . Attends Archivist Meetings:   Marland Kitchen Marital Status:    Family History  Problem Relation Age of Onset  . Pancreatic cancer Mother   . Kidney cancer Mother   . Heart disease Father   . Lung cancer Father        smoker  .  Prostate cancer Other   . Breast cancer Maternal Aunt        x 2  . Colon cancer Neg Hx   . Rectal cancer Neg Hx   . Stomach cancer Neg Hx    - negative except otherwise stated in the family history section Allergies  Allergen Reactions  . Latex Other (See Comments)    Blisters    Prior to Admission medications   Medication Sig Start Date End Date Taking? Authorizing Provider  cholecalciferol (VITAMIN D3) 25 MCG (1000 UNIT) tablet Take 1,000 Units by mouth daily.   Yes [provider]  Multiple Vitamins-Minerals (MULTIVITAMIN WITH MINERALS) tablet Take 1 tablet by mouth daily.   Yes [provider]  triamterene-hydrochlorothiazide (DYAZIDE) 37.5-25 MG per capsule Take 1 capsule by mouth every morning.   Yes [provider]  zinc gluconate 50 MG tablet Take 50  mg by mouth daily.   Yes [provider]   DG Tibia/Fibula Left Port  Result Date: 10/02/2019 CLINICAL DATA:  Pain following fall EXAM: PORTABLE LEFT TIBIA AND FIBULA - 2 VIEW COMPARISON:  None. FINDINGS: Frontal and lateral views were obtained. There is a comminuted fracture of the distal fibular diaphysis with lateral displacement as well as posterior and medial angulation of the distal major fracture fragment with respect to the major proximal fragment. There is ankle mortise disruption with the hindfoot posterior to the tibial plafond and. There is an impaction type injury along the posterior distal tibia. No more proximal fractures are evident. IMPRESSION: Comminuted fracture distal fibular diaphysis with angulation and displacement distally. Impaction type fracture of the posterior aspect of the distal tibia with posterior displacement of the distal fragment. There is ankle mortise disruption with the hindfoot posterior to the distal tibia. There is apparent impaction of the distal tibial plafond posteriorly against the talar dome. Electronically Signed   By: Lowella Grip III M.D.   On: 10/02/2019 11:18   DG Ankle Left Port  Result Date: 10/02/2019 CLINICAL DATA:  Pain following fall EXAM: PORTABLE LEFT ANKLE - 2 VIEW COMPARISON:  None. FINDINGS: Frontal and lateral views obtained. Comminuted fracture of the distal fibular diaphysis noted with lateral displacement as well as posterior and medial angulation of the distal major fracture fragment with SPECT the proximal fragment. There is a fracture along the posterior aspect of the distal tibia. There is impaction of the talar dome along the posterior tibial plafond with ankle mortise disruption. A focus of calcification medial to the distal tibial diaphysis may represent an avulsion in this area as well. There are small calcaneal spurs. IMPRESSION: Probable comminuted fracture distal fibula. Fracture along the posterior distal tibia. Ankle  mortise disruption with the hindfoot posterior to the tibia with impaction of the talar dome against the posterior tibial plafond. Question avulsion along the medial distal tibial metaphysis. Electronically Signed   By: Lowella Grip III M.D.   On: 10/02/2019 11:20   - pertinent xrays, CT, MRI studies were reviewed and independently interpreted  Positive ROS: All other systems have been reviewed and were otherwise negative with the exception of those mentioned in the HPI and as above.  Physical Exam: General: Alert, no acute distress Psychiatric: Patient is competent for consent with normal mood and affect Lymphatic: No axillary or cervical lymphadenopathy Cardiovascular: No pedal edema Respiratory: No cyanosis, no use of accessory musculature GI: No organomegaly, abdomen is soft and non-tender    Images:  @ENCIMAGES @  Labs:  No results found for: HGBA1C,  ESRSEDRATE, CRP, LABURIC, REPTSTATUS, GRAMSTAIN, CULT, LABORGA  No results found for: ALBUMIN, PREALBUMIN, LABURIC  Neurologic: Patient does  have protective sensation bilateral lower extremities.   MUSCULOSKELETAL:   Skin: Examination of the skin is intact there is no blistering no open fracture.  After reduction patient had a palpable dorsalis pedis and posterior tibial pulse.  Examination patient has a fracture dislocation of the left ankle radiograph shows a Weber C fracture pattern.  Assessment: Weber C fracture dislocation of the left ankle.  Plan: After informed consent patient underwent conscious sedation and I performed a closed reduction of the dislocated ankle a posterior and sugar tong splint was applied.  Patient tolerated the procedure well.  Due to the unstable nature of the fracture patient will need open reduction internal fixation.  We will plan for surgery tomorrow morning with fixation of the fibula fixation of the syndesmosis.  Risks and benefits were discussed including infection neurovascular  injury pain arthritis DVT need for additional surgery.  Patient states she understands wished to proceed at this time.  Thank you for the consult and the opportunity to see Ms. Deirdre Evener, [place] [phone] 1:20 PM

## 2019-10-02 NOTE — ED Triage Notes (Signed)
Pt arrives via EMS from home with complaints of a fall.Pt was loading up her car when she slipped on the wet grass and fell. Pt denies LOC, denies blood thinners.   Deformity noted by EMS of left lower extremity. Pain 10/10. Given 23mcg  fentanyl en route to hospital. Pain decreased 5/10.  136/74 P: 82 98% RA RR 20

## 2019-10-03 ENCOUNTER — Encounter (HOSPITAL_COMMUNITY)
Admission: EM | Disposition: A | Discharge status: Home Health | Payer: Self-pay | Source: Home / Self Care | Attending: Orthopedic Surgery

## 2019-10-03 ENCOUNTER — Observation Stay (HOSPITAL_COMMUNITY): Payer: Medicare Other | Admitting: Anesthesiology

## 2019-10-03 ENCOUNTER — Observation Stay (HOSPITAL_COMMUNITY): Payer: Medicare Other

## 2019-10-03 DIAGNOSIS — Q899 Congenital malformation, unspecified: Secondary | ICD-10-CM | POA: Diagnosis present

## 2019-10-03 DIAGNOSIS — W19XXXA Unspecified fall, initial encounter: Secondary | ICD-10-CM | POA: Diagnosis present

## 2019-10-03 DIAGNOSIS — S93432A Sprain of tibiofibular ligament of left ankle, initial encounter: Secondary | ICD-10-CM | POA: Diagnosis not present

## 2019-10-03 DIAGNOSIS — M810 Age-related osteoporosis without current pathological fracture: Secondary | ICD-10-CM | POA: Diagnosis present

## 2019-10-03 DIAGNOSIS — Z8249 Family history of ischemic heart disease and other diseases of the circulatory system: Secondary | ICD-10-CM | POA: Diagnosis not present

## 2019-10-03 DIAGNOSIS — Z20822 Contact with and (suspected) exposure to covid-19: Secondary | ICD-10-CM | POA: Diagnosis present

## 2019-10-03 DIAGNOSIS — Z79899 Other long term (current) drug therapy: Secondary | ICD-10-CM | POA: Diagnosis not present

## 2019-10-03 DIAGNOSIS — Z91040 Latex allergy status: Secondary | ICD-10-CM | POA: Diagnosis not present

## 2019-10-03 DIAGNOSIS — S82842A Displaced bimalleolar fracture of left lower leg, initial encounter for closed fracture: Secondary | ICD-10-CM | POA: Diagnosis present

## 2019-10-03 DIAGNOSIS — I10 Essential (primary) hypertension: Secondary | ICD-10-CM | POA: Diagnosis present

## 2019-10-03 HISTORY — PX: ORIF ANKLE FRACTURE: SHX5408

## 2019-10-03 LAB — BASIC METABOLIC PANEL
Anion gap: 10 (ref 5–15)
BUN: 13 mg/dL (ref 8–23)
CO2: 24 mmol/L (ref 22–32)
Calcium: 8.6 mg/dL — ABNORMAL LOW (ref 8.9–10.3)
Chloride: 106 mmol/L (ref 98–111)
Creatinine, Ser: 0.94 mg/dL (ref 0.44–1.00)
GFR calc Af Amer: >60 mL/min (ref >60)
GFR calc non Af Amer: >60 mL/min (ref >60)
Glucose, Bld: 106 mg/dL — ABNORMAL HIGH (ref 70–99)
Potassium: 3.9 mmol/L (ref 3.5–5.1)
Sodium: 140 mmol/L (ref 135–145)

## 2019-10-03 LAB — CBC
HCT: 40 % (ref 36.0–46.0)
Hemoglobin: 13 g/dL (ref 12.0–15.0)
MCH: 30.4 pg (ref 26.0–34.0)
MCHC: 32.5 g/dL (ref 30.0–36.0)
MCV: 93.7 fL (ref 80.0–100.0)
Platelets: 265 10*3/uL (ref 150–400)
RBC: 4.27 MIL/uL (ref 3.87–5.11)
RDW: 12.8 % (ref 11.5–15.5)
WBC: 12.2 10*3/uL — ABNORMAL HIGH (ref 4.0–10.5)
nRBC: 0 % (ref 0.0–0.2)

## 2019-10-03 SURGERY — OPEN REDUCTION INTERNAL FIXATION (ORIF) ANKLE FRACTURE
Anesthesia: General | Site: Ankle | Laterality: Left

## 2019-10-03 MED ORDER — FENTANYL CITRATE (PF) 250 MCG/5ML IJ SOLN
INTRAMUSCULAR | Status: AC
  Filled 2019-10-03: qty 5

## 2019-10-03 MED ORDER — MIDAZOLAM HCL 2 MG/2ML IJ SOLN
INTRAMUSCULAR | Status: AC
  Filled 2019-10-03: qty 2

## 2019-10-03 MED ORDER — CEFAZOLIN SODIUM-DEXTROSE 2-4 GM/100ML-% IV SOLN
2.0000 g | INTRAVENOUS | Status: AC
  Administered 2019-10-03: 2 g via INTRAVENOUS

## 2019-10-03 MED ORDER — POVIDONE-IODINE 10 % EX SWAB: 2.0000 "application " | Freq: Once | CUTANEOUS | Status: DC

## 2019-10-03 MED ORDER — ONDANSETRON HCL 4 MG/2ML IJ SOLN
INTRAMUSCULAR | Status: DC | PRN
  Administered 2019-10-03: 4 mg via INTRAVENOUS

## 2019-10-03 MED ORDER — PROMETHAZINE HCL 25 MG/ML IJ SOLN: 6.2500 mg | INTRAMUSCULAR | Status: DC | PRN

## 2019-10-03 MED ORDER — OXYCODONE HCL 5 MG PO TABS
5.0000 mg | ORAL_TABLET | ORAL | Status: DC | PRN
  Administered 2019-10-03: 5 mg via ORAL
  Filled 2019-10-03: qty 1

## 2019-10-03 MED ORDER — METOCLOPRAMIDE HCL 5 MG PO TABS: 5.0000 mg | ORAL_TABLET | Freq: Three times a day (TID) | ORAL | Status: DC | PRN

## 2019-10-03 MED ORDER — LIDOCAINE 2% (20 MG/ML) 5 ML SYRINGE
INTRAMUSCULAR | Status: DC | PRN
  Administered 2019-10-03: 60 mg via INTRAVENOUS

## 2019-10-03 MED ORDER — BISACODYL 10 MG RE SUPP: 10.0000 mg | Freq: Every day | RECTAL | Status: DC | PRN

## 2019-10-03 MED ORDER — ROCURONIUM BROMIDE 10 MG/ML (PF) SYRINGE
PREFILLED_SYRINGE | INTRAVENOUS | Status: AC
  Filled 2019-10-03: qty 10

## 2019-10-03 MED ORDER — POLYETHYLENE GLYCOL 3350 17 G PO PACK: 17.0000 g | PACK | Freq: Every day | ORAL | Status: DC | PRN

## 2019-10-03 MED ORDER — ONDANSETRON HCL 4 MG PO TABS: 4.0000 mg | ORAL_TABLET | Freq: Four times a day (QID) | ORAL | Status: DC | PRN

## 2019-10-03 MED ORDER — CHLORHEXIDINE GLUCONATE 4 % EX LIQD
60.0000 mL | Freq: Once | CUTANEOUS | Status: AC
  Administered 2019-10-03: 4 via TOPICAL

## 2019-10-03 MED ORDER — HYDROMORPHONE HCL 1 MG/ML IJ SOLN: 0.5000 mg | INTRAMUSCULAR | Status: DC | PRN

## 2019-10-03 MED ORDER — ONDANSETRON HCL 4 MG/2ML IJ SOLN: 4.0000 mg | Freq: Four times a day (QID) | INTRAMUSCULAR | Status: DC | PRN

## 2019-10-03 MED ORDER — PHENYLEPHRINE 40 MCG/ML (10ML) SYRINGE FOR IV PUSH (FOR BLOOD PRESSURE SUPPORT)
PREFILLED_SYRINGE | INTRAVENOUS | Status: AC
  Filled 2019-10-03: qty 10

## 2019-10-03 MED ORDER — LIDOCAINE 2% (20 MG/ML) 5 ML SYRINGE
INTRAMUSCULAR | Status: AC
  Filled 2019-10-03: qty 5

## 2019-10-03 MED ORDER — ASPIRIN EC 325 MG PO TBEC
325.0000 mg | DELAYED_RELEASE_TABLET | Freq: Every day | ORAL | Status: DC
  Administered 2019-10-03 – 2019-10-04 (×2): 325 mg via ORAL
  Filled 2019-10-03 (×2): qty 1

## 2019-10-03 MED ORDER — METHOCARBAMOL 1000 MG/10ML IJ SOLN
500.0000 mg | Freq: Four times a day (QID) | INTRAVENOUS | Status: DC | PRN
  Filled 2019-10-03: qty 5

## 2019-10-03 MED ORDER — SUCCINYLCHOLINE CHLORIDE 200 MG/10ML IV SOSY
PREFILLED_SYRINGE | INTRAVENOUS | Status: AC
  Filled 2019-10-03: qty 10

## 2019-10-03 MED ORDER — PROPOFOL 10 MG/ML IV BOLUS
INTRAVENOUS | Status: AC
  Filled 2019-10-03: qty 20

## 2019-10-03 MED ORDER — DOCUSATE SODIUM 100 MG PO CAPS
100.0000 mg | ORAL_CAPSULE | Freq: Two times a day (BID) | ORAL | Status: DC
  Administered 2019-10-03 – 2019-10-04 (×3): 100 mg via ORAL
  Filled 2019-10-03 (×3): qty 1

## 2019-10-03 MED ORDER — METOCLOPRAMIDE HCL 5 MG/ML IJ SOLN: 5.0000 mg | Freq: Three times a day (TID) | INTRAMUSCULAR | Status: DC | PRN

## 2019-10-03 MED ORDER — CEFAZOLIN SODIUM-DEXTROSE 1-4 GM/50ML-% IV SOLN
1.0000 g | Freq: Four times a day (QID) | INTRAVENOUS | Status: AC
  Administered 2019-10-03 – 2019-10-04 (×3): 1 g via INTRAVENOUS
  Filled 2019-10-03 (×3): qty 50

## 2019-10-03 MED ORDER — OXYCODONE HCL 5 MG PO TABS: 10.0000 mg | ORAL_TABLET | ORAL | Status: DC | PRN

## 2019-10-03 MED ORDER — METHOCARBAMOL 500 MG PO TABS
500.0000 mg | ORAL_TABLET | Freq: Four times a day (QID) | ORAL | Status: DC | PRN
  Administered 2019-10-03: 500 mg via ORAL
  Filled 2019-10-03: qty 1

## 2019-10-03 MED ORDER — ROPIVACAINE HCL 5 MG/ML IJ SOLN
INTRAMUSCULAR | Status: DC | PRN
  Administered 2019-10-03: 15 mL via PERINEURAL
  Administered 2019-10-03: 25 mL via PERINEURAL

## 2019-10-03 MED ORDER — SODIUM CHLORIDE 0.9 % IV SOLN: INTRAVENOUS | Status: DC

## 2019-10-03 MED ORDER — 0.9 % SODIUM CHLORIDE (POUR BTL) OPTIME
TOPICAL | Status: DC | PRN
  Administered 2019-10-03: 1000 mL

## 2019-10-03 MED ORDER — DEXAMETHASONE SODIUM PHOSPHATE 10 MG/ML IJ SOLN
INTRAMUSCULAR | Status: DC | PRN
  Administered 2019-10-03: 8 mg via INTRAVENOUS

## 2019-10-03 MED ORDER — MIDAZOLAM HCL 5 MG/5ML IJ SOLN
INTRAMUSCULAR | Status: DC | PRN
  Administered 2019-10-03: 2 mg via INTRAVENOUS

## 2019-10-03 MED ORDER — CEFAZOLIN SODIUM-DEXTROSE 2-4 GM/100ML-% IV SOLN: 2.0000 g | INTRAVENOUS | Status: DC

## 2019-10-03 MED ORDER — SODIUM CHLORIDE 0.45 % IV SOLN: INTRAVENOUS | Status: DC

## 2019-10-03 MED ORDER — FENTANYL CITRATE (PF) 100 MCG/2ML IJ SOLN: 25.0000 ug | INTRAMUSCULAR | Status: DC | PRN

## 2019-10-03 MED ORDER — FENTANYL CITRATE (PF) 100 MCG/2ML IJ SOLN
INTRAMUSCULAR | Status: DC | PRN
  Administered 2019-10-03 (×2): 50 ug via INTRAVENOUS

## 2019-10-03 MED ORDER — ACETAMINOPHEN 325 MG PO TABS: 325.0000 mg | ORAL_TABLET | Freq: Four times a day (QID) | ORAL | Status: DC | PRN

## 2019-10-03 MED ORDER — LACTATED RINGERS IV SOLN: INTRAVENOUS | Status: DC | PRN

## 2019-10-03 MED ORDER — MAGNESIUM CITRATE PO SOLN: 1.0000 | Freq: Once | ORAL | Status: DC | PRN

## 2019-10-03 SURGICAL SUPPLY — 44 items
BIT DRILL 2.5X110 QC LCP DISP (BIT) ×2 IMPLANT
BIT DRILL CANN 2.7X625 NONSTRL (BIT) ×2 IMPLANT
BNDG COHESIVE 4X5 TAN STRL (GAUZE/BANDAGES/DRESSINGS) ×2 IMPLANT
BNDG GAUZE ELAST 4 BULKY (GAUZE/BANDAGES/DRESSINGS) ×2 IMPLANT
COVER SURGICAL LIGHT HANDLE (MISCELLANEOUS) ×2 IMPLANT
DRAPE OEC MINIVIEW 54X84 (DRAPES) IMPLANT
DRAPE U-SHAPE 47X51 STRL (DRAPES) ×2 IMPLANT
DRSG ADAPTIC 3X8 NADH LF (GAUZE/BANDAGES/DRESSINGS) ×2 IMPLANT
DRSG PAD ABDOMINAL 8X10 ST (GAUZE/BANDAGES/DRESSINGS) ×2 IMPLANT
DURAPREP 26ML APPLICATOR (WOUND CARE) ×2 IMPLANT
ELECT REM PT RETURN 9FT ADLT (ELECTROSURGICAL) ×2
ELECTRODE REM PT RTRN 9FT ADLT (ELECTROSURGICAL) ×1 IMPLANT
GAUZE SPONGE 4X4 12PLY STRL (GAUZE/BANDAGES/DRESSINGS) ×2 IMPLANT
GLOVE BIOGEL PI IND STRL 9 (GLOVE) ×1 IMPLANT
GLOVE BIOGEL PI INDICATOR 9 (GLOVE) ×1
GLOVE SURG ORTHO 9.0 STRL STRW (GLOVE) ×2 IMPLANT
GOWN STRL REUS W/ TWL XL LVL3 (GOWN DISPOSABLE) ×2 IMPLANT
GOWN STRL REUS W/TWL XL LVL3 (GOWN DISPOSABLE) ×4
GUIDEWIRE THREADED 150MM (WIRE) ×2 IMPLANT
KIT BASIN OR (CUSTOM PROCEDURE TRAY) ×2 IMPLANT
KIT TURNOVER KIT B (KITS) ×2 IMPLANT
NS IRRIG 1000ML POUR BTL (IV SOLUTION) ×2 IMPLANT
PACK ORTHO EXTREMITY (CUSTOM PROCEDURE TRAY) ×2 IMPLANT
PAD ARMBOARD 7.5X6 YLW CONV (MISCELLANEOUS) ×4 IMPLANT
PLATE LCP 3.5 1/3 TUB 8HX93 (Plate) ×2 IMPLANT
SCREW CORTEX 3.5 12MM (Screw) ×2 IMPLANT
SCREW CORTEX 3.5 14MM (Screw) ×1 IMPLANT
SCREW CORTEX 3.5 16MM (Screw) ×1 IMPLANT
SCREW CORTEX 3.5 40MM (Screw) ×1 IMPLANT
SCREW CORTEX 3.5 50MM (Screw) ×2 IMPLANT
SCREW LOCK CORT ST 3.5X12 (Screw) ×2 IMPLANT
SCREW LOCK CORT ST 3.5X14 (Screw) ×1 IMPLANT
SCREW LOCK CORT ST 3.5X16 (Screw) ×1 IMPLANT
SCREW LOCK CORT ST 3.5X40 (Screw) ×1 IMPLANT
SCREW SHORT THREAD 4.0X40 (Screw) ×2 IMPLANT
STAPLER VISISTAT 35W (STAPLE) IMPLANT
SUCTION FRAZIER HANDLE 10FR (MISCELLANEOUS) ×2
SUCTION TUBE FRAZIER 10FR DISP (MISCELLANEOUS) ×1 IMPLANT
SUT ETHILON 2 0 PSLX (SUTURE) ×2 IMPLANT
SUT VIC AB 2-0 CT1 27 (SUTURE) ×2
SUT VIC AB 2-0 CT1 TAPERPNT 27 (SUTURE) ×1 IMPLANT
TOWEL GREEN STERILE (TOWEL DISPOSABLE) ×2 IMPLANT
TOWEL GREEN STERILE FF (TOWEL DISPOSABLE) ×2 IMPLANT
TUBE CONNECTING 12X1/4 (SUCTIONS) ×2 IMPLANT

## 2019-10-03 NOTE — Op Note (Signed)
10/03/2019  8:59 AM  PATIENT:  Jamie Jensen    PRE-OPERATIVE DIAGNOSIS: Bimalleolar left ankle fracture with disruption of the syndesmosis  POST-OPERATIVE DIAGNOSIS:  Same  PROCEDURE:  OPEN REDUCTION INTERNAL FIXATION (ORIF) ANKLE FRACTURE of both the lateral malleolus and medial malleolus with internal fixation of the syndesmosis  SURGEON:  Newt Minion, MD  PHYSICIAN ASSISTANT:None ANESTHESIA:   General  PREOPERATIVE INDICATIONS:  Jamie Jensen is a  74 y.o. female with a diagnosis of left ankle fracture who failed conservative measures and elected for surgical management.    The risks benefits and alternatives were discussed with the patient preoperatively including but not limited to the risks of infection, bleeding, nerve injury, cardiopulmonary complications, the need for revision surgery, among others, and the patient was willing to proceed.  OPERATIVE IMPLANTS: Synthes one third tubular plate with 3.5 compression screws  @ENCIMAGES @  OPERATIVE FINDINGS: C-arm fluoroscopy post reduction shows a congruent mortise.  OPERATIVE PROCEDURE: Patient was brought the operating room and underwent a general anesthetic.  After adequate levels anesthesia were obtained patient's left lower extremity was prepped using DuraPrep draped into a sterile field a timeout was called.  A lateral incision was made over the fibula this is carried sharply down to bone and subperiosteal dissection was used to freshen the fracture this was irrigated reduced with a clamp and a one third tubular plate was placed laterally with 2 compression screws proximally a compression screw distally C-arm fluoroscopy was then used to guide internal fixation for syndesmotic fixation.  A 50 and 40 mm 3.5 cortical screw was used to reduce the syndesmosis.  C-arm fluoroscopy verified reduction.  Attention was then focused on the medial malleolus.  The medial malleolus was nondisplaced a guidewire was placed centimeters  fluoroscopy verified alignment and a 40 mm partially-threaded cannulated screw was used to stabilize the medial malleolus.  C-arm fluoroscopy again verified reduction of the mortise.  Wounds were irrigated normal saline incisions were closed using 2-0 nylon sterile dressing was applied patient was extubated taken the PACU in stable condition.   DISCHARGE PLANNING:  Antibiotic duration: 24-hour  Weightbearing: Nonweightbearing on the left  Pain medication: Opioid pathway  Dressing care/ Wound VAC: Dry dressing  Ambulatory devices: Walker  Discharge to: Anticipate discharge to home  Follow-up: In the office 1 week post operative.

## 2019-10-03 NOTE — Progress Notes (Signed)
Orthopedic Tech Progress Note Patient Details:  ELLE WISLER 1945/12/01 [id number]  Ortho Devices Type of Ortho Device: CAM walker Splint Material: Fiberglass Ortho Device/Splint Location: LLE Ortho Device/Splint Interventions: Ordered, Application   Post Interventions Patient Tolerated: Well Instructions Provided: Adjustment of device, Care of device   Ellayna Hilligoss 10/03/2019, 10:22 AM

## 2019-10-03 NOTE — Plan of Care (Signed)
  Problem: Education: Goal: Knowledge of General Education information will improve Description: Including pain rating scale, medication(s)/side effects and non-pharmacologic comfort measures Outcome: Progressing   Problem: Health Behavior/Discharge Planning: Goal: Ability to manage health-related needs will improve Outcome: Progressing   Problem: Clinical Measurements: Goal: Ability to maintain clinical measurements within normal limits will improve Outcome: Progressing Goal: Will remain free from infection Outcome: Progressing   Problem: Activity: Goal: Risk for activity intolerance will decrease Outcome: Progressing   Problem: Nutrition: Goal: Adequate nutrition will be maintained Outcome: Progressing   Problem: Coping: Goal: Level of anxiety will decrease Outcome: Progressing   Problem: Elimination: Goal: Will not experience complications related to bowel motility Outcome: Progressing   Problem: Pain Managment: Goal: General experience of comfort will improve Outcome: Progressing   Problem: Safety: Goal: Ability to remain free from injury will improve Outcome: Progressing

## 2019-10-03 NOTE — Anesthesia Procedure Notes (Signed)
Anesthesia Regional Block: Popliteal block   Pre-Anesthetic Checklist: ,, timeout performed, Correct Patient, Correct Site, Correct Laterality, Correct Procedure, Correct Position, site marked, Risks and benefits discussed,  Surgical consent,  Pre-op evaluation,  At surgeon's request and post-op pain management  Laterality: Left  Prep: chloraprep       Needles:  Injection technique: Single-shot  Needle Type: Echogenic Stimulator Needle          Additional Needles:   Narrative:  Start time: 10/03/2019 7:24 AM End time: 10/03/2019 7:34 AM Injection made incrementally with aspirations every 5 mL.  Performed by: Personally  Anesthesiologist: Duane Boston, MD  Additional Notes: A functioning IV was confirmed and monitors were applied.  Sterile prep and drape, hand hygiene and sterile gloves were used.  Negative aspiration and test dose prior to incremental administration of local anesthetic. The patient tolerated the procedure well.Ultrasound  guidance: relevant anatomy identified, needle position confirmed, local anesthetic spread visualized around nerve(s), vascular puncture avoided.  Image printed for medical record.

## 2019-10-03 NOTE — Interval H&P Note (Signed)
History and Physical Interval Note:  10/03/2019 7:42 AM  Jamie Jensen  has presented today for surgery, with the diagnosis of left ankle fracture.  The various methods of treatment have been discussed with the patient and family. After consideration of risks, benefits and other options for treatment, the patient has consented to  Procedure(s): OPEN REDUCTION INTERNAL FIXATION (ORIF) ANKLE FRACTURE (Left) as a surgical intervention.  The patient's history has been reviewed, patient examined, no change in status, stable for surgery.  I have reviewed the patient's chart and labs.  Questions were answered to the patient's satisfaction.     Newt Minion

## 2019-10-03 NOTE — Anesthesia Postprocedure Evaluation (Signed)
Anesthesia Post Note  Patient: [name]  Procedure(s) Performed: OPEN REDUCTION INTERNAL FIXATION (ORIF) ANKLE FRACTURE (Left Ankle)     Patient location during evaluation: PACU Anesthesia Type: General Level of consciousness: sedated Pain management: pain level controlled Vital Signs Assessment: post-procedure vital signs reviewed and stable Respiratory status: spontaneous breathing and respiratory function stable Cardiovascular status: stable Postop Assessment: no apparent nausea or vomiting Anesthetic complications: no    Last Vitals:  Vitals:   10/03/19 0910 10/03/19 0925  BP:  (!) 141/75  Pulse:  72  Resp:  18  Temp: (!) 36.1 C 36.7 C  SpO2:  95%    Last Pain:  Vitals:   10/03/19 0905  TempSrc:   PainSc: 0-No pain                 Yardley Lekas DANIEL

## 2019-10-03 NOTE — Transfer of Care (Signed)
Immediate Anesthesia Transfer of Care Note  Patient: Jamie Jensen  Procedure(s) Performed: OPEN REDUCTION INTERNAL FIXATION (ORIF) ANKLE FRACTURE (Left Ankle)  Patient Location: PACU  Anesthesia Type:General  Level of Consciousness: awake, alert  and oriented  Airway & Oxygen Therapy: Patient connected to face mask oxygen  Post-op Assessment: Post -op Vital signs reviewed and stable  Post vital signs: stable  Last Vitals:  Vitals Value Taken Time  BP    Temp    Pulse 90 10/03/19 0849  Resp 14 10/03/19 0849  SpO2 99 % 10/03/19 0849  Vitals shown include unvalidated device data.  Last Pain:  Vitals:   10/03/19 0510  TempSrc: Oral  PainSc:          Complications: No apparent anesthesia complications

## 2019-10-03 NOTE — Anesthesia Procedure Notes (Signed)
Procedure Name: LMA Insertion Date/Time: 10/03/2019 7:50 AM Performed by: Lavell Luster, CRNA Pre-anesthesia Checklist: Patient identified, Emergency Drugs available, Suction available, Patient being monitored and Timeout performed Patient Re-evaluated:Patient Re-evaluated prior to induction Oxygen Delivery Method: Circle system utilized Preoxygenation: Pre-oxygenation with 100% oxygen Induction Type: IV induction LMA: LMA inserted LMA Size: 4.0 Number of attempts: 1 Placement Confirmation: breath sounds checked- equal and bilateral and positive ETCO2 Tube secured with: Tape Dental Injury: Teeth and Oropharynx as per pre-operative assessment

## 2019-10-04 ENCOUNTER — Encounter: Payer: Self-pay | Admitting: *Deleted

## 2019-10-04 MED ORDER — OXYCODONE-ACETAMINOPHEN 5-325 MG PO TABS: 1.0000 | ORAL_TABLET | ORAL | 0 refills | Status: DC | PRN

## 2019-10-04 NOTE — Plan of Care (Signed)
  Problem: Education: Goal: Knowledge of General Education information will improve Description: Including pain rating scale, medication(s)/side effects and non-pharmacologic comfort measures Outcome: Progressing   Problem: Health Behavior/Discharge Planning: Goal: Ability to manage health-related needs will improve Outcome: Progressing   Problem: Clinical Measurements: Goal: Ability to maintain clinical measurements within normal limits will improve Outcome: Progressing Goal: Will remain free from infection Outcome: Progressing   Problem: Activity: Goal: Risk for activity intolerance will decrease Outcome: Progressing   Problem: Nutrition: Goal: Adequate nutrition will be maintained Outcome: Progressing   Problem: Coping: Goal: Level of anxiety will decrease Outcome: Progressing   Problem: Elimination: Goal: Will not experience complications related to bowel motility Outcome: Progressing   Problem: Pain Managment: Goal: General experience of comfort will improve Outcome: Progressing   Problem: Safety: Goal: Ability to remain free from injury will improve Outcome: Progressing

## 2019-10-04 NOTE — Progress Notes (Signed)
Patient ID: Jamie Jensen, female   DOB: June 27, 1945, 74 y.o.   MRN: [id number] Patient is postoperative day 1 open reduction internal fixation bimalleolar and syndesmotic fracture.  Patient states she has no pain she feels like the feeling is starting to return to her foot.  Plan for discharge after patient is seen by therapy today.

## 2019-10-04 NOTE — Progress Notes (Signed)
Discharge summary provided to pt with explaination. Pt verbalized understanding of instructions. Questions and concerns were answered satisfactorily. No complaints voiced. Pt alert/oriented in no apparent distress. Spouse is responsible for the pt's transportation.

## 2019-10-04 NOTE — Evaluation (Signed)
Physical Therapy Evaluation Patient Details Name: Jamie Jensen MRN: [id number] DOB: 1945/11/20 Today's Date: 10/04/2019   History of Present Illness  Pt is a 74 y/o female who presents s/p mechanical fall sustaining a bimalleolar ankle fracture. She is now s/p L ORIF and is NWB. PMH significant for oseoporosis, HTN, diverticulosis, endometriosis.   Clinical Impression  This patient presents with acute pain and decreased functional independence following the above mentioned procedure. At the time of PT eval, pt was able to perform transfers and ambulation with modified independence to supervision for safety. Husband was present for education as well and was hands-on during stair training. Overall I anticipate pt will progress well. I do feel she would benefit from a youth size walker however one was not available as today is a holiday, and pt plans to take a standard RW home instead. This patient is appropriate for skilled PT interventions to address functional limitations, improve safety and independence with functional mobility, and return to PLOF.    Follow Up Recommendations Home health PT;Supervision for mobility/OOB    Equipment Recommendations  Rolling walker with 5" wheels(youth size; tub bench)    Recommendations for Other Services       Precautions / Restrictions Precautions Precautions: Fall Required Braces or Orthoses: Other Brace Other Brace: CAM walker Restrictions Weight Bearing Restrictions: Yes LLE Weight Bearing: Non weight bearing      Mobility  Bed Mobility Overal bed mobility: Modified Independent             General bed mobility comments: Increased time but no assist required.   Transfers Overall transfer level: Modified independent Equipment used: Rolling walker (2 wheeled)             General transfer comment: VC's for hand placement on seated surface for safety. No assist to power up to full stand.   Ambulation/Gait Ambulation/Gait  assistance: Supervision Gait Distance (Feet): 100 Feet(50'x2) Assistive device: Rolling walker (2 wheeled) Gait Pattern/deviations: Step-to pattern;Decreased stride length Gait velocity: Decreased Gait velocity interpretation: <1.8 ft/sec, indicate of risk for recurrent falls General Gait Details: Hop-to pattern. appeared effortful due to walker being a tad high even at lowest adjustment - would benefit from a youth size.   Stairs Stairs: Yes Stairs assistance: Min guard Stair Management: No rails;Backwards;With walker Number of Stairs: 2 General stair comments: VC's for sequencing and safety with RW for support. Husband present for stair training as well. Pt was able to complete 2 stairs without LOB.   Wheelchair Mobility    Modified Rankin (Stroke Patients Only)       Balance Overall balance assessment: Needs assistance Sitting-balance support: Feet supported;No upper extremity supported Sitting balance-Leahy Scale: Normal     Standing balance support: Bilateral upper extremity supported Standing balance-Leahy Scale: Poor Standing balance comment: Reliant on UE support to maintain NWB status                             Pertinent Vitals/Pain Pain Assessment: Faces Faces Pain Scale: Hurts a little bit Pain Location: L ankle. Pt reports she feels like the block is still in effect.  Pain Descriptors / Indicators: Operative site guarding Pain Intervention(s): Limited activity within patient's tolerance;Monitored during session;Repositioned    Home Living Family/patient expects to be discharged to:: Private residence Living Arrangements: Spouse/significant other Available Help at Discharge: Family;Available 24 hours/day Type of Home: House Home Access: Stairs to enter Entrance Stairs-Rails: Right;Left Entrance Stairs-Number of Steps: 3  Home Layout: One level Home Equipment: Cane - single point      Prior Function Level of Independence: Independent                Hand Dominance        Extremity/Trunk Assessment   Upper Extremity Assessment Upper Extremity Assessment: Overall WFL for tasks assessed    Lower Extremity Assessment Lower Extremity Assessment: LLE deficits/detail LLE Deficits / Details: Decreased strength and AROM consistent with above mentioned injury and subsequent procedure.     Cervical / Trunk Assessment Cervical / Trunk Assessment: Normal  Communication   Communication: No difficulties  Cognition Arousal/Alertness: Awake/alert Behavior During Therapy: WFL for tasks assessed/performed Overall Cognitive Status: Within Functional Limits for tasks assessed                                        General Comments      Exercises     Assessment/Plan    PT Assessment Patient needs continued PT services  PT Problem List Decreased strength;Decreased range of motion;Decreased activity tolerance;Decreased balance;Decreased mobility;Decreased knowledge of use of DME;Decreased safety awareness;Decreased knowledge of precautions;Pain       PT Treatment Interventions DME instruction;Gait training;Stair training;Functional mobility training;Therapeutic activities;Therapeutic exercise;Neuromuscular re-education;Patient/family education    PT Goals (Current goals can be found in the Care Plan section)  Acute Rehab PT Goals Patient Stated Goal: Home this afternoon PT Goal Formulation: With patient/family Time For Goal Achievement: 10/11/19 Potential to Achieve Goals: Good    Frequency Min 5X/week   Barriers to discharge        Co-evaluation               AM-PAC PT "6 Clicks" Mobility  Outcome Measure Help needed turning from your back to your side while in a flat bed without using bedrails?: None Help needed moving from lying on your back to sitting on the side of a flat bed without using bedrails?: None Help needed moving to and from a bed to a chair (including a wheelchair)?:  None Help needed standing up from a chair using your arms (e.g., wheelchair or bedside chair)?: None Help needed to walk in hospital room?: None Help needed climbing 3-5 steps with a railing? : A Little 6 Click Score: 23    End of Session Equipment Utilized During Treatment: Gait belt Activity Tolerance: Patient tolerated treatment well Patient left: in bed;with call bell/phone within reach;with family/visitor present Nurse Communication: Mobility status PT Visit Diagnosis: Unsteadiness on feet (R26.81);Pain Pain - Right/Left: Left Pain - part of body: Ankle and joints of foot    Time: [id number] PT Time Calculation (min) (ACUTE ONLY): 25 min   Charges:   PT Evaluation $PT Eval Moderate Complexity: 1 Mod PT Treatments $Gait Training: 8-22 mins        Rolinda Roan, PT, DPT Acute Rehabilitation Services Pager: [phone] Office: [place] 10/04/2019, 1:48 PM

## 2019-10-04 NOTE — TOC Initial Note (Signed)
Transition of Care [facility]) - Initial/Assessment Note    Patient Details  Name: Jamie Jensen MRN: [id number] Date of Birth: 08/07/1945  Transition of [facility]) CM/SW Contact:    Curlene Labrum, RN Phone Number: 10/04/2019, 10:05 AM  Clinical Narrative:                 Case management met with the patient and spouse at the bedside, S/P Left ankle fracture repair by Dr. Sharol Given.  Patient waiting on PT evaluation - patient given choice regarding home health and dme and patient did not have a company preference regarding either service.  Patient states that she normally gets dme in the past from [facility].  I called [facility] and set patient up for PT - patient will be nonweightbearing for the next 3 weeks per Dr. Sharol Given.  The patient's husband is driving her home today.  Expected Discharge Plan: Mobile Barriers to Discharge: No Barriers Identified   Patient Goals and CMS Choice Patient states their goals for this hospitalization and ongoing recovery are:: I'm ready to get better and go home today. CMS Medicare.gov Compare Post Acute Care list provided to:: Patient Choice offered to / list presented to : Patient  Expected Discharge Plan and Services Expected Discharge Plan: [place]   Discharge Planning Services: CM Consult Post Acute Care Choice: Durable Medical Equipment Living arrangements for the past 2 months: Single Family Home Expected Discharge Date: 10/04/19               DME Arranged: Gilford Rile rolling DME Agency: [employer] Date DME Agency Contacted: 10/04/19 Time DME Agency Contacted: [id number] Representative spoke with at DME Agency: [place]: PT [place]: [facility] (formerly [employer]) Date [place]: 10/04/19 Time [place]: 44 Representative spoke with at [place]: Joen Laura, RNCM  Prior Living Arrangements/Services Living arrangements for the past 2  months: [place] with:: Spouse Patient language and need for interpreter reviewed:: Yes Do you feel safe going back to the place where you live?: Yes      Need for Family Participation in Patient Care: Yes (Comment) Care giver support system in place?: Yes (comment)   Criminal Activity/Legal Involvement Pertinent to Current Situation/Hospitalization: No - Comment as needed  Activities of Daily Living Home Assistive Devices/Equipment: None ADL Screening (condition at time of admission) Patient's cognitive ability adequate to safely complete daily activities?: Yes Is the patient deaf or have difficulty hearing?: No Does the patient have difficulty seeing, even when wearing glasses/contacts?: No Does the patient have difficulty concentrating, remembering, or making decisions?: No Patient able to express need for assistance with ADLs?: Yes Does the patient have difficulty dressing or bathing?: No Independently performs ADLs?: Yes (appropriate for developmental age) Does the patient have difficulty walking or climbing stairs?: Yes Weakness of Legs: Left Weakness of Arms/Hands: None  Permission Sought/Granted Permission sought to share information with : Case Manager Permission granted to share information with : Yes, Verbal Permission Granted        Permission granted to share info w Relationship: spouse     Emotional Assessment Appearance:: Appears stated age Attitude/Demeanor/Rapport: Engaged, Gracious Affect (typically observed): Accepting Orientation: : Oriented to Self, Oriented to Place, Oriented to  Time, Oriented to Situation Alcohol / Substance Use: Not Applicable Psych Involvement: No (comment)  Admission diagnosis:  Deformity [Q89.9] Closed left ankle fracture [S82.892A] Closed fracture of distal  end of left fibula, unspecified fracture morphology, initial encounter [Y38.365Q] Patient Active Problem List   Diagnosis Date Noted  . Closed left ankle  fracture 10/02/2019  . Ankle dislocation, left, initial encounter   . Syndesmotic disruption of ankle, left, initial encounter   . Closed bimalleolar fracture of left ankle   . Syncope 08/07/2012  . Goiter 08/07/2012  . CELLULITIS/ABSCESS NOS 07/04/2009  . OTHER DISEASES OF NASAL CAVITY AND SINUSES 06/02/2009  . OSTEOPOROSIS 07/03/2007   PCP:  Wayland Salinas, MD Pharmacy:   [place], [address] [place] [address] Phone: [phone] Fax: [phone]     Social Determinants of Health (SDOH) Interventions    Readmission Risk Interventions No flowsheet data found.

## 2019-10-04 NOTE — Discharge Summary (Signed)
Discharge Diagnoses:  Active Problems:   Closed bimalleolar fracture of left ankle   Closed left ankle fracture   Surgeries: Procedure(s): OPEN REDUCTION INTERNAL FIXATION (ORIF) ANKLE FRACTURE on 10/03/2019    Consultants: Treatment Team:  Newt Minion, MD  Discharged Condition: Improved  Hospital Course: Jamie Jensen is an 74 y.o. female who was admitted 10/02/2019 with a chief complaint of left ankle fracture, with a final diagnosis of left ankle fracture.  Patient was brought to the operating room on 10/03/2019 and underwent Procedure(s): OPEN REDUCTION INTERNAL FIXATION (ORIF) ANKLE FRACTURE.    Patient was given perioperative antibiotics:  Anti-infectives (From admission, onward)   Start     Dose/Rate Route Frequency Ordered Stop   10/03/19 1400  ceFAZolin (ANCEF) IVPB 1 g/50 mL premix     1 g 100 mL/hr over 30 Minutes Intravenous Every 6 hours 10/03/19 0926 10/04/19 0154   10/03/19 0600  ceFAZolin (ANCEF) IVPB 2g/100 mL premix     2 g 200 mL/hr over 30 Minutes Intravenous On call to O.R. 10/03/19 [id number] 10/03/19 0826   10/03/19 0600  ceFAZolin (ANCEF) IVPB 2g/100 mL premix  Status:  Discontinued     2 g 200 mL/hr over 30 Minutes Intravenous On call to O.R. 10/03/19 [id number] 10/03/19 [id number]    .  Patient was given sequential compression devices, early ambulation, and aspirin for DVT prophylaxis.  Recent vital signs:  Patient Vitals for the past 24 hrs:  BP Temp Temp src Pulse Resp SpO2  10/04/19 0721 (!) 148/82 98 F (36.7 C) Oral 62 17 97 %  10/04/19 0555 139/73 98.8 F (37.1 C) Oral 65 16 98 %  10/04/19 0018 126/68 98.3 F (36.8 C) Oral 82 16 96 %  10/03/19 1951 124/65 98.2 F (36.8 C) Oral 78 16 94 %  10/03/19 1900 117/71 -- -- 98 -- --  10/03/19 1332 120/62 98 F (36.7 C) -- 87 18 94 %  10/03/19 0925 (!) 141/75 98 F (36.7 C) -- 72 18 95 %  10/03/19 0910 -- (!) 97 F (36.1 C) -- -- -- --  10/03/19 0905 135/67 -- -- 80 13 93 %  10/03/19 0850 (!) 147/78 (!)  96.8 F (36 C) -- 93 12 97 %  .  Recent laboratory studies: DG Tibia/Fibula Left Port  Result Date: 10/02/2019 CLINICAL DATA:  Pain following fall EXAM: PORTABLE LEFT TIBIA AND FIBULA - 2 VIEW COMPARISON:  None. FINDINGS: Frontal and lateral views were obtained. There is a comminuted fracture of the distal fibular diaphysis with lateral displacement as well as posterior and medial angulation of the distal major fracture fragment with respect to the major proximal fragment. There is ankle mortise disruption with the hindfoot posterior to the tibial plafond and. There is an impaction type injury along the posterior distal tibia. No more proximal fractures are evident. IMPRESSION: Comminuted fracture distal fibular diaphysis with angulation and displacement distally. Impaction type fracture of the posterior aspect of the distal tibia with posterior displacement of the distal fragment. There is ankle mortise disruption with the hindfoot posterior to the distal tibia. There is apparent impaction of the distal tibial plafond posteriorly against the talar dome. Electronically Signed   By: Lowella Grip III M.D.   On: 10/02/2019 11:18   DG Ankle Left Port  Result Date: 10/02/2019 CLINICAL DATA:  Post reduction for fracture EXAM: PORTABLE LEFT ANKLE - 2 VIEW COMPARISON:  Pre reduction radiographs left ankle Oct 02, 2019 FINDINGS: Frontal and lateral views obtained.  The comminuted fracture of the distal fibular diaphysis is in near anatomic alignment with milder medial angulation distally. The posterior angulation is no longer evident. The displacement is no longer evident in this area. There has been reduction ankle mortise dislocation. There is a fracture along the posterior tibia with alignment essentially anatomic. No new fracture. A small avulsion is noted along the medial distal right tibial metaphysis. IMPRESSION: Reduction of ankle mortise dislocation. Significantly less angulation and displacement at  the fibular fracture fragment. Fracture of the posterior tibia in essentially anatomic alignment. Persistent small avulsion along the medial distal tibia. Electronically Signed   By: Lowella Grip III M.D.   On: 10/02/2019 13:47   DG Ankle Left Port  Result Date: 10/02/2019 CLINICAL DATA:  Pain following fall EXAM: PORTABLE LEFT ANKLE - 2 VIEW COMPARISON:  None. FINDINGS: Frontal and lateral views obtained. Comminuted fracture of the distal fibular diaphysis noted with lateral displacement as well as posterior and medial angulation of the distal major fracture fragment with SPECT the proximal fragment. There is a fracture along the posterior aspect of the distal tibia. There is impaction of the talar dome along the posterior tibial plafond with ankle mortise disruption. A focus of calcification medial to the distal tibial diaphysis may represent an avulsion in this area as well. There are small calcaneal spurs. IMPRESSION: Probable comminuted fracture distal fibula. Fracture along the posterior distal tibia. Ankle mortise disruption with the hindfoot posterior to the tibia with impaction of the talar dome against the posterior tibial plafond. Question avulsion along the medial distal tibial metaphysis. Electronically Signed   By: Lowella Grip III M.D.   On: 10/02/2019 11:20   DG MINI C-ARM IMAGE ONLY  Result Date: 10/03/2019 There is no interpretation for this exam.  This order is for images obtained during a surgical procedure.  Please See "Surgeries" Tab for more information regarding the procedure.    Discharge Medications:   Allergies as of 10/04/2019      Reactions   Latex Other (See Comments)   Blisters      Medication List    TAKE these medications   cholecalciferol 25 MCG (1000 UNIT) tablet Commonly known as: VITAMIN D3 Take 1,000 Units by mouth daily.   multivitamin with minerals tablet Take 1 tablet by mouth daily.   oxyCODONE-acetaminophen 5-325 MG tablet Commonly  known as: PERCOCET/ROXICET Take 1 tablet by mouth every 4 (four) hours as needed.   triamterene-hydrochlorothiazide 37.5-25 MG capsule Commonly known as: DYAZIDE Take 1 capsule by mouth every morning.   zinc gluconate 50 MG tablet Take 50 mg by mouth daily.            Discharge Care Instructions  (From admission, onward)         Start     Ordered   10/04/19 0000  Touch down weight bearing    Question Answer Comment  Laterality left   Extremity Lower      10/04/19 0831          Diagnostic Studies: DG Tibia/Fibula Left Port  Result Date: 10/02/2019 CLINICAL DATA:  Pain following fall EXAM: PORTABLE LEFT TIBIA AND FIBULA - 2 VIEW COMPARISON:  None. FINDINGS: Frontal and lateral views were obtained. There is a comminuted fracture of the distal fibular diaphysis with lateral displacement as well as posterior and medial angulation of the distal major fracture fragment with respect to the major proximal fragment. There is ankle mortise disruption with the hindfoot posterior to the tibial plafond and.  There is an impaction type injury along the posterior distal tibia. No more proximal fractures are evident. IMPRESSION: Comminuted fracture distal fibular diaphysis with angulation and displacement distally. Impaction type fracture of the posterior aspect of the distal tibia with posterior displacement of the distal fragment. There is ankle mortise disruption with the hindfoot posterior to the distal tibia. There is apparent impaction of the distal tibial plafond posteriorly against the talar dome. Electronically Signed   By: Lowella Grip III M.D.   On: 10/02/2019 11:18   DG Ankle Left Port  Result Date: 10/02/2019 CLINICAL DATA:  Post reduction for fracture EXAM: PORTABLE LEFT ANKLE - 2 VIEW COMPARISON:  Pre reduction radiographs left ankle Oct 02, 2019 FINDINGS: Frontal and lateral views obtained. The comminuted fracture of the distal fibular diaphysis is in near anatomic alignment  with milder medial angulation distally. The posterior angulation is no longer evident. The displacement is no longer evident in this area. There has been reduction ankle mortise dislocation. There is a fracture along the posterior tibia with alignment essentially anatomic. No new fracture. A small avulsion is noted along the medial distal right tibial metaphysis. IMPRESSION: Reduction of ankle mortise dislocation. Significantly less angulation and displacement at the fibular fracture fragment. Fracture of the posterior tibia in essentially anatomic alignment. Persistent small avulsion along the medial distal tibia. Electronically Signed   By: Lowella Grip III M.D.   On: 10/02/2019 13:47   DG Ankle Left Port  Result Date: 10/02/2019 CLINICAL DATA:  Pain following fall EXAM: PORTABLE LEFT ANKLE - 2 VIEW COMPARISON:  None. FINDINGS: Frontal and lateral views obtained. Comminuted fracture of the distal fibular diaphysis noted with lateral displacement as well as posterior and medial angulation of the distal major fracture fragment with SPECT the proximal fragment. There is a fracture along the posterior aspect of the distal tibia. There is impaction of the talar dome along the posterior tibial plafond with ankle mortise disruption. A focus of calcification medial to the distal tibial diaphysis may represent an avulsion in this area as well. There are small calcaneal spurs. IMPRESSION: Probable comminuted fracture distal fibula. Fracture along the posterior distal tibia. Ankle mortise disruption with the hindfoot posterior to the tibia with impaction of the talar dome against the posterior tibial plafond. Question avulsion along the medial distal tibial metaphysis. Electronically Signed   By: Lowella Grip III M.D.   On: 10/02/2019 11:20   DG MINI C-ARM IMAGE ONLY  Result Date: 10/03/2019 There is no interpretation for this exam.  This order is for images obtained during a surgical procedure.  Please See  "Surgeries" Tab for more information regarding the procedure.    Patient benefited maximally from their hospital stay and there were no complications.     Disposition: Discharge disposition: 01-Home or Self Care      Discharge Instructions    Ambulatory referral to Social Work   Complete by: As directed    Call MD / Call 911   Complete by: As directed    If you experience chest pain or shortness of breath, CALL 911 and be transported to the hospital emergency room.  If you develope a fever above 101 F, pus (white drainage) or increased drainage or redness at the wound, or calf pain, call your surgeon's office.   Constipation Prevention   Complete by: As directed    Drink plenty of fluids.  Prune juice may be helpful.  You may use a stool softener, such as Colace (over the  counter) 100 mg twice a day.  Use MiraLax (over the counter) for constipation as needed.   Diet - low sodium heart healthy   Complete by: As directed    Increase activity slowly as tolerated   Complete by: As directed    Touch down weight bearing   Complete by: As directed    Laterality: left   Extremity: Lower     Follow-up Information    Newt Minion, MD In 1 week.   Specialty: Orthopedic Surgery Contact information: [address] [place] [address] [phone]            Signed: Newt Minion 10/04/2019, 8:31 AM

## 2019-10-06 ENCOUNTER — Telehealth: Payer: Self-pay | Admitting: Orthopedic Surgery

## 2019-10-06 NOTE — Telephone Encounter (Signed)
Cindy from [facility]  Letting us know the patient is scheduled for a start of care 10/07/19  Call back: [phone]

## 2019-10-07 NOTE — Telephone Encounter (Signed)
Noted  

## 2019-10-08 ENCOUNTER — Encounter: Payer: Self-pay | Admitting: Family

## 2019-10-08 ENCOUNTER — Ambulatory Visit (INDEPENDENT_AMBULATORY_CARE_PROVIDER_SITE_OTHER): Payer: Medicare Other | Admitting: Family

## 2019-10-08 ENCOUNTER — Other Ambulatory Visit: Payer: Self-pay

## 2019-10-08 ENCOUNTER — Telehealth: Payer: Self-pay | Admitting: Orthopedic Surgery

## 2019-10-08 VITALS — Ht 60.0 in | Wt 175.0 lb

## 2019-10-08 DIAGNOSIS — S82842D Displaced bimalleolar fracture of left lower leg, subsequent encounter for closed fracture with routine healing: Secondary | ICD-10-CM

## 2019-10-08 NOTE — Progress Notes (Signed)
Post-Op Visit Note   Patient: Jamie Jensen           Date of Birth: April 29, 1946           MRN: [id number] Visit Date: 10/08/2019 PCP: Wayland Salinas, MD  Chief Complaint:  Chief Complaint  Patient presents with  . Left Ankle - Routine Post Op    10/03/19 ORIF left ankle 1 wk post/op    HPI:  HPI Patient is a 74 year old woman who presents today status post ORIF left ankle fracture on May 30.  She has had some bleeding after removing the surgical dressing she is feeling a little queasy in the throbbing pain in her ankle overall she states she is been doing well she has been working on elevation she has been nonweightbearing in a fracture boot.  Ortho Exam Incision approximated sutures there is minimal bleeding there is no surrounding erythema no odor moderate swelling no sign of infection  Visit Diagnoses:  1. Closed bimalleolar fracture of left ankle with routine healing, subsequent encounter     Plan: Begin daily dose of cleansing.  Dry dressing changes.  Continue nonweightbearing elevating she is considering getting a kneeling scooter.  She will follow-up in 1 week with radiographs consider suture removal at the time.  Follow-Up Instructions: Return in about 1 week (around 10/15/2019).   Imaging: No results found.  Orders:  No orders of the defined types were placed in this encounter.  No orders of the defined types were placed in this encounter.    PMFS History: Patient Active Problem List   Diagnosis Date Noted  . Closed left ankle fracture 10/02/2019  . Ankle dislocation, left, initial encounter   . Syndesmotic disruption of ankle, left, initial encounter   . Closed bimalleolar fracture of left ankle   . Syncope 08/07/2012  . Goiter 08/07/2012  . CELLULITIS/ABSCESS NOS 07/04/2009  . OTHER DISEASES OF NASAL CAVITY AND SINUSES 06/02/2009  . OSTEOPOROSIS 07/03/2007   Past Medical History:  Diagnosis Date  . Anal fissure   . Anemia    in the past  with surgery but no transfusion  . CELLULITIS/ABSCESS NOS 07/04/2009  . Chronic sore throat   . Diverticulosis   . Endometriosis   . Gallstones   . Hx of gastric ulcer   . Hypertension   . OSTEOPOROSIS 07/03/2007  . Other diseases of nasal cavity and sinuses(478.19) 06/02/2009  . Snores     Family History  Problem Relation Age of Onset  . Pancreatic cancer Mother   . Kidney cancer Mother   . Heart disease Father   . Lung cancer Father        smoker  . Prostate cancer Other   . Breast cancer Maternal Aunt        x 2  . Colon cancer Neg Hx   . Rectal cancer Neg Hx   . Stomach cancer Neg Hx     Past Surgical History:  Procedure Laterality Date  . BLADDER REPAIR  1993   post perforation during surgery for endometriosis  . BREAST BIOPSY  [id number]   rt and lt-negative  . CESAREAN SECTION     x 2  . CHOLECYSTECTOMY  2000  . COLONOSCOPY  09-28-2004   tics only  . complete hysterectomy  1985   following partial hysterectomy  . DILATION AND CURETTAGE OF UTERUS    . LAPAROSCOPIC ENDOMETRIOSIS FULGURATION  1993   with subsequent bladder perforation  . ORIF ANKLE FRACTURE Left 10/03/2019  Procedure: OPEN REDUCTION INTERNAL FIXATION (ORIF) ANKLE FRACTURE;  Surgeon: Newt Minion, MD;  Location: [facility];  Service: Orthopedics;  Laterality: Left;  . PARTIAL HYSTERECTOMY    . ROTATOR CUFF REPAIR Right   . SHOULDER ARTHROSCOPY  2010   right  . TONSILLECTOMY AND ADENOIDECTOMY Bilateral 08/09/2013   Procedure: BILATERAL TONSILLECTOMY ;  Surgeon: Ascencion Dike, MD;  Location: [facility];  Service: ENT;  Laterality: Bilateral;   Social History   Occupational History  . Occupation: semi-retired  Tobacco Use  . Smoking status: Never Smoker  . Smokeless tobacco: Never Used  Substance and Sexual Activity  . Alcohol use: Yes    Alcohol/week: 0.0 standard drinks    Comment: occasionaly  . Drug use: No  . Sexual activity: Not on file

## 2019-10-08 NOTE — Telephone Encounter (Signed)
Jamie Jensen was called and advised that patient is non-wtb and she should follow-up with office in 1 week for suture removal and updates on wtb status then.

## 2019-10-08 NOTE — Telephone Encounter (Signed)
Michelle from [facility] called.   Wants to know how long the patient is to be non weight baring and is she allowed to do any excercises outside of her boot.  Call back: [phone]

## 2019-10-11 ENCOUNTER — Telehealth: Payer: Self-pay

## 2019-10-11 NOTE — Telephone Encounter (Signed)
10/03/19 left ORIF ankle fx. Called pt and she and her husband state that there is a thumb nail sized amount of bright red drain with the bandage change today. I advised the pt that we are happy to see her in the office today. She states that the incision is not open and no other concerns she advised that she will watch it today and call me tomorrow with an update. I advised to remain non weight bearing, to elevate higher than her heart, to use ice several times a day and ace for compression. Sometimes swelling can cause this small amount of drainage and pt states that she will be more consistent with her elevation and check back in with me tomorrow. I will hold message until then.

## 2019-10-11 NOTE — Telephone Encounter (Signed)
Patient would like to know if it is normal for her left ankle to still be bleeding?  Patient had ORIF, left ankle on 10/03/2019, patient was seen on Friday, 10/08/2019.  Cb# [phone].  Please advise.  Thank you.

## 2019-10-15 ENCOUNTER — Other Ambulatory Visit: Payer: Self-pay

## 2019-10-15 ENCOUNTER — Ambulatory Visit: Payer: Self-pay

## 2019-10-15 ENCOUNTER — Ambulatory Visit (INDEPENDENT_AMBULATORY_CARE_PROVIDER_SITE_OTHER): Payer: Medicare Other | Admitting: Family

## 2019-10-15 ENCOUNTER — Encounter: Payer: Self-pay | Admitting: Family

## 2019-10-15 VITALS — Ht 60.0 in | Wt 175.0 lb

## 2019-10-15 DIAGNOSIS — S82842D Displaced bimalleolar fracture of left lower leg, subsequent encounter for closed fracture with routine healing: Secondary | ICD-10-CM

## 2019-10-15 NOTE — Progress Notes (Signed)
Post-Op Visit Note   Patient: Jamie Jensen           Date of Birth: Feb 24, 1946           MRN: [id number] Visit Date: 10/15/2019 PCP: Wayland Salinas, MD  Chief Complaint: No chief complaint on file.   HPI:  HPI Patient is a 74 year old woman seen today 2 weeks status post ORIF left ankle fracture on 10/03/19. Has been doing daily dry dressing changes. Continues non weight bearing in CAM walker.   Ortho Exam Incision is clean, dry and intact. No gaping. Healing well.  Visit Diagnoses:  1. Closed bimalleolar fracture of left ankle with routine healing, subsequent encounter     Plan: continue daily dial soap cleansing. Dry dressings. Cam walker. Begin rom. No weightbearing.   Follow-Up Instructions: Return in about 2 weeks (around 10/29/2019).   Imaging: No results found.  Orders:  No orders of the defined types were placed in this encounter.  No orders of the defined types were placed in this encounter.    PMFS History: Patient Active Problem List   Diagnosis Date Noted  . Closed left ankle fracture 10/02/2019  . Ankle dislocation, left, initial encounter   . Syndesmotic disruption of ankle, left, initial encounter   . Closed bimalleolar fracture of left ankle   . Syncope 08/07/2012  . Goiter 08/07/2012  . CELLULITIS/ABSCESS NOS 07/04/2009  . OTHER DISEASES OF NASAL CAVITY AND SINUSES 06/02/2009  . OSTEOPOROSIS 07/03/2007   Past Medical History:  Diagnosis Date  . Anal fissure   . Anemia    in the past with surgery but no transfusion  . CELLULITIS/ABSCESS NOS 07/04/2009  . Chronic sore throat   . Diverticulosis   . Endometriosis   . Gallstones   . Hx of gastric ulcer   . Hypertension   . OSTEOPOROSIS 07/03/2007  . Other diseases of nasal cavity and sinuses(478.19) 06/02/2009  . Snores     Family History  Problem Relation Age of Onset  . Pancreatic cancer Mother   . Kidney cancer Mother   . Heart disease Father   . Lung cancer Father         smoker  . Prostate cancer Other   . Breast cancer Maternal Aunt        x 2  . Colon cancer Neg Hx   . Rectal cancer Neg Hx   . Stomach cancer Neg Hx     Past Surgical History:  Procedure Laterality Date  . BLADDER REPAIR  1993   post perforation during surgery for endometriosis  . BREAST BIOPSY  [id number]   rt and lt-negative  . CESAREAN SECTION     x 2  . CHOLECYSTECTOMY  2000  . COLONOSCOPY  09-28-2004   tics only  . complete hysterectomy  1985   following partial hysterectomy  . DILATION AND CURETTAGE OF UTERUS    . LAPAROSCOPIC ENDOMETRIOSIS FULGURATION  1993   with subsequent bladder perforation  . ORIF ANKLE FRACTURE Left 10/03/2019   Procedure: OPEN REDUCTION INTERNAL FIXATION (ORIF) ANKLE FRACTURE;  Surgeon: Newt Minion, MD;  Location: [facility];  Service: Orthopedics;  Laterality: Left;  . PARTIAL HYSTERECTOMY    . ROTATOR CUFF REPAIR Right   . SHOULDER ARTHROSCOPY  2010   right  . TONSILLECTOMY AND ADENOIDECTOMY Bilateral 08/09/2013   Procedure: BILATERAL TONSILLECTOMY ;  Surgeon: Ascencion Dike, MD;  Location: [facility];  Service: ENT;  Laterality: Bilateral;  Social History   Occupational History  . Occupation: semi-retired  Tobacco Use  . Smoking status: Never Smoker  . Smokeless tobacco: Never Used  Substance and Sexual Activity  . Alcohol use: Yes    Alcohol/week: 0.0 standard drinks    Comment: occasionaly  . Drug use: No  . Sexual activity: Not on file

## 2019-10-18 ENCOUNTER — Encounter: Payer: Self-pay | Admitting: Physician Assistant

## 2019-10-18 ENCOUNTER — Ambulatory Visit (INDEPENDENT_AMBULATORY_CARE_PROVIDER_SITE_OTHER): Payer: Medicare Other | Admitting: Orthopedic Surgery

## 2019-10-18 ENCOUNTER — Other Ambulatory Visit: Payer: Self-pay

## 2019-10-18 VITALS — Ht 60.0 in | Wt 175.0 lb

## 2019-10-18 DIAGNOSIS — S82842D Displaced bimalleolar fracture of left lower leg, subsequent encounter for closed fracture with routine healing: Secondary | ICD-10-CM

## 2019-10-19 ENCOUNTER — Encounter: Payer: Self-pay | Admitting: Orthopedic Surgery

## 2019-10-19 NOTE — Progress Notes (Signed)
Office Visit Note   Patient: Jamie Jensen           Date of Birth: 03-12-1946           MRN: [id number] Visit Date: 10/18/2019              Requested by: Wayland Salinas, MD [address] [place],  [place] [id number] PCP: Wayland Salinas, MD  Chief Complaint  Patient presents with  . Left Ankle - Routine Post Op    10/03/19 ORIF left ankle fx       HPI: Patient is a 74 year old woman who is 2 weeks status post open reduction internal fixation left ankle she is currently nonweightbearing in a fracture boot in a wheelchair.  She complains of increasing pain and swelling this Saturday.  Patient states she takes Percocet at night to help with sleep.  Assessment & Plan: Visit Diagnoses:  1. Closed bimalleolar fracture of left ankle with routine healing, subsequent encounter     Plan: Patient has no clinical signs of infection or DVT.  Recommended dorsiflexion exercises recommended compression stocking recommend a baby aspirin a day.  Three-view radiographs of the left ankle at follow-up.  Follow-Up Instructions: Return in about 2 weeks (around 11/01/2019).   Ortho Exam  Patient is alert, oriented, no adenopathy, well-dressed, normal affect, normal respiratory effort. Examination patient skin wrinkles well she has no pain with active or passive range of motion of the ankle.  There are no venous ulcers the surgical incisions are healing well there is no cellulitis no signs of infection.  Imaging: No results found. No images are attached to the encounter.  Labs: No results found for: HGBA1C, ESRSEDRATE, CRP, LABURIC, REPTSTATUS, GRAMSTAIN, CULT, LABORGA   No results found for: ALBUMIN, PREALBUMIN, LABURIC  No results found for: MG No results found for: VD25OH  No results found for: PREALBUMIN CBC EXTENDED Latest Ref Rng & Units 10/03/2019 10/02/2019 08/09/2013  WBC 4.0 - 10.5 K/uL 12.2(H) 10.7(H) -  RBC 3.87 - 5.11 MIL/uL 4.27 4.53 -  HGB 12.0  - 15.0 g/dL 13.0 13.9 15.7(H)  HCT 36 - 46 % 40.0 41.5 -  PLT 150 - 400 K/uL 265 270 -  NEUTROABS 1.7 - 7.7 K/uL - 9.2(H) -  LYMPHSABS 0.7 - 4.0 K/uL - 0.9 -     Body mass index is 34.18 kg/m.  Orders:  No orders of the defined types were placed in this encounter.  No orders of the defined types were placed in this encounter.    Procedures: No procedures performed  Clinical Data: No additional findings.  ROS:  All other systems negative, except as noted in the HPI. Review of Systems  Objective: Vital Signs: Ht 5' (1.524 m)   Wt 175 lb (79.4 kg)   BMI 34.18 kg/m   Specialty Comments:  No specialty comments available.  PMFS History: Patient Active Problem List   Diagnosis Date Noted  . Closed left ankle fracture 10/02/2019  . Ankle dislocation, left, initial encounter   . Syndesmotic disruption of ankle, left, initial encounter   . Closed bimalleolar fracture of left ankle   . Syncope 08/07/2012  . Goiter 08/07/2012  . CELLULITIS/ABSCESS NOS 07/04/2009  . OTHER DISEASES OF NASAL CAVITY AND SINUSES 06/02/2009  . OSTEOPOROSIS 07/03/2007   Past Medical History:  Diagnosis Date  . Anal fissure   . Anemia    in the past with surgery but no transfusion  . CELLULITIS/ABSCESS NOS 07/04/2009  .  Chronic sore throat   . Diverticulosis   . Endometriosis   . Gallstones   . Hx of gastric ulcer   . Hypertension   . OSTEOPOROSIS 07/03/2007  . Other diseases of nasal cavity and sinuses(478.19) 06/02/2009  . Snores     Family History  Problem Relation Age of Onset  . Pancreatic cancer Mother   . Kidney cancer Mother   . Heart disease Father   . Lung cancer Father        smoker  . Prostate cancer Other   . Breast cancer Maternal Aunt        x 2  . Colon cancer Neg Hx   . Rectal cancer Neg Hx   . Stomach cancer Neg Hx     Past Surgical History:  Procedure Laterality Date  . BLADDER REPAIR  1993   post perforation during surgery for endometriosis  . BREAST  BIOPSY  [id number]   rt and lt-negative  . CESAREAN SECTION     x 2  . CHOLECYSTECTOMY  2000  . COLONOSCOPY  09-28-2004   tics only  . complete hysterectomy  1985   following partial hysterectomy  . DILATION AND CURETTAGE OF UTERUS    . LAPAROSCOPIC ENDOMETRIOSIS FULGURATION  1993   with subsequent bladder perforation  . ORIF ANKLE FRACTURE Left 10/03/2019   Procedure: OPEN REDUCTION INTERNAL FIXATION (ORIF) ANKLE FRACTURE;  Surgeon: Newt Minion, MD;  Location: [facility];  Service: Orthopedics;  Laterality: Left;  . PARTIAL HYSTERECTOMY    . ROTATOR CUFF REPAIR Right   . SHOULDER ARTHROSCOPY  2010   right  . TONSILLECTOMY AND ADENOIDECTOMY Bilateral 08/09/2013   Procedure: BILATERAL TONSILLECTOMY ;  Surgeon: Ascencion Dike, MD;  Location: [facility];  Service: ENT;  Laterality: Bilateral;   Social History   Occupational History  . Occupation: semi-retired  Tobacco Use  . Smoking status: Never Smoker  . Smokeless tobacco: Never Used  Substance and Sexual Activity  . Alcohol use: Yes    Alcohol/week: 0.0 standard drinks    Comment: occasionaly  . Drug use: No  . Sexual activity: Not on file

## 2019-10-25 ENCOUNTER — Encounter: Payer: Self-pay | Admitting: Orthopedic Surgery

## 2019-10-29 ENCOUNTER — Encounter: Payer: Self-pay | Admitting: Family

## 2019-10-29 ENCOUNTER — Other Ambulatory Visit: Payer: Self-pay

## 2019-10-29 ENCOUNTER — Ambulatory Visit (INDEPENDENT_AMBULATORY_CARE_PROVIDER_SITE_OTHER): Payer: Medicare Other

## 2019-10-29 ENCOUNTER — Ambulatory Visit (INDEPENDENT_AMBULATORY_CARE_PROVIDER_SITE_OTHER): Payer: Medicare Other | Admitting: Family

## 2019-10-29 VITALS — Ht 60.0 in | Wt 175.0 lb

## 2019-10-29 DIAGNOSIS — S82842D Displaced bimalleolar fracture of left lower leg, subsequent encounter for closed fracture with routine healing: Secondary | ICD-10-CM

## 2019-10-29 NOTE — Progress Notes (Signed)
Post-Op Visit Note   Patient: Jamie Jensen           Date of Birth: 10-Jul-1945           MRN: [id number] Visit Date: 10/29/2019 PCP: Wayland Salinas, MD  Chief Complaint:  Chief Complaint  Patient presents with  . Left Ankle - Follow-up    10/03/2019 ORIF Left Ankle    HPI:  HPI Patient is a 74 year old woman who presents status post left anke ORIF. Has been doing well. Mild swelling. No concerns. In CAM walker NTWTB. Has been working on rom, home PT to arrive today. Using 1/2 a percocet once daily.  Ortho Exam Incisions are well healed. Mild edema to LLE. No erythema or warmth. No sign of infection. Dorsiflexion to neutral.  Visit Diagnoses:  1. Closed bimalleolar fracture of left ankle with routine healing, subsequent encounter     Plan: may begin WBAT in cam walker. Continue compression garments for swelling. Follow up in 2 weeks.   Follow-Up Instructions: No follow-ups on file.   Imaging: No results found.  Orders:  Orders Placed This Encounter  Procedures  . XR Ankle Complete Left   No orders of the defined types were placed in this encounter.    PMFS History: Patient Active Problem List   Diagnosis Date Noted  . Closed left ankle fracture 10/02/2019  . Ankle dislocation, left, initial encounter   . Syndesmotic disruption of ankle, left, initial encounter   . Closed bimalleolar fracture of left ankle   . Syncope 08/07/2012  . Goiter 08/07/2012  . CELLULITIS/ABSCESS NOS 07/04/2009  . OTHER DISEASES OF NASAL CAVITY AND SINUSES 06/02/2009  . OSTEOPOROSIS 07/03/2007   Past Medical History:  Diagnosis Date  . Anal fissure   . Anemia    in the past with surgery but no transfusion  . CELLULITIS/ABSCESS NOS 07/04/2009  . Chronic sore throat   . Diverticulosis   . Endometriosis   . Gallstones   . Hx of gastric ulcer   . Hypertension   . OSTEOPOROSIS 07/03/2007  . Other diseases of nasal cavity and sinuses(478.19) 06/02/2009  . Snores     Family  History  Problem Relation Age of Onset  . Pancreatic cancer Mother   . Kidney cancer Mother   . Heart disease Father   . Lung cancer Father        smoker  . Prostate cancer Other   . Breast cancer Maternal Aunt        x 2  . Colon cancer Neg Hx   . Rectal cancer Neg Hx   . Stomach cancer Neg Hx     Past Surgical History:  Procedure Laterality Date  . BLADDER REPAIR  1993   post perforation during surgery for endometriosis  . BREAST BIOPSY  [id number]   rt and lt-negative  . CESAREAN SECTION     x 2  . CHOLECYSTECTOMY  2000  . COLONOSCOPY  09-28-2004   tics only  . complete hysterectomy  1985   following partial hysterectomy  . DILATION AND CURETTAGE OF UTERUS    . LAPAROSCOPIC ENDOMETRIOSIS FULGURATION  1993   with subsequent bladder perforation  . ORIF ANKLE FRACTURE Left 10/03/2019   Procedure: OPEN REDUCTION INTERNAL FIXATION (ORIF) ANKLE FRACTURE;  Surgeon: Newt Minion, MD;  Location: [facility];  Service: Orthopedics;  Laterality: Left;  . PARTIAL HYSTERECTOMY    . ROTATOR CUFF REPAIR Right   . SHOULDER ARTHROSCOPY  2010   right  .  TONSILLECTOMY AND ADENOIDECTOMY Bilateral 08/09/2013   Procedure: BILATERAL TONSILLECTOMY ;  Surgeon: Ascencion Dike, MD;  Location: [facility];  Service: ENT;  Laterality: Bilateral;   Social History   Occupational History  . Occupation: semi-retired  Tobacco Use  . Smoking status: Never Smoker  . Smokeless tobacco: Never Used  Substance and Sexual Activity  . Alcohol use: Yes    Alcohol/week: 0.0 standard drinks    Comment: occasionaly  . Drug use: No  . Sexual activity: Not on file

## 2019-11-02 ENCOUNTER — Other Ambulatory Visit: Payer: Self-pay

## 2019-11-02 ENCOUNTER — Ambulatory Visit (AMBULATORY_SURGERY_CENTER): Payer: Self-pay

## 2019-11-02 VITALS — Ht 60.0 in | Wt 170.0 lb

## 2019-11-02 DIAGNOSIS — Z860100 Personal history of colon polyps, unspecified: Secondary | ICD-10-CM

## 2019-11-02 DIAGNOSIS — Z1211 Encounter for screening for malignant neoplasm of colon: Secondary | ICD-10-CM

## 2019-11-02 NOTE — Progress Notes (Signed)
No allergies to soy or egg Pt is not on blood thinners or diet pills Denies issues with sedation/intubation Denies atrial flutter/fib Denies constipation    Pt is aware of Covid safety and care partner requirements.   Wt was self report.    Has boot on d/t fx ankle-unable to use scales today.  States she can bear wt and will be able to manage the movements needed for her procedure.

## 2019-11-03 ENCOUNTER — Telehealth: Payer: Self-pay

## 2019-11-03 NOTE — Telephone Encounter (Signed)
Sharyn Lull from [facility] wants an extension order for patient to be seen 1x for 4 more weeks. Or until she starts driving

## 2019-11-03 NOTE — Telephone Encounter (Signed)
Called and gave verbal ok for orders as requested below. To call with any questions. LM ON VM

## 2019-11-12 ENCOUNTER — Ambulatory Visit (INDEPENDENT_AMBULATORY_CARE_PROVIDER_SITE_OTHER): Payer: Medicare Other | Admitting: Family

## 2019-11-12 ENCOUNTER — Ambulatory Visit (INDEPENDENT_AMBULATORY_CARE_PROVIDER_SITE_OTHER): Payer: Medicare Other

## 2019-11-12 ENCOUNTER — Encounter: Payer: Self-pay | Admitting: Family

## 2019-11-12 ENCOUNTER — Other Ambulatory Visit: Payer: Self-pay

## 2019-11-12 VITALS — Ht 60.0 in | Wt 170.0 lb

## 2019-11-12 DIAGNOSIS — M79672 Pain in left foot: Secondary | ICD-10-CM | POA: Diagnosis not present

## 2019-11-12 NOTE — Progress Notes (Signed)
Post-Op Visit Note   Patient: Jamie Jensen           Date of Birth: Dec 05, 1945           MRN: [id number] Visit Date: 11/12/2019 PCP: Wayland Salinas, MD  Chief Complaint:  Chief Complaint  Patient presents with  . Left Ankle - Routine Post Op    10/03/19 ORIF left ankle fx     HPI:  HPI Patient is a 74 year old woman who presents today 6 weeks status post ORIF left ankle fracture she has been in a cam walker for the last 6 weeks she has been weightbearing since last visit has begun home physical therapy.  She feels well and has no concerns of her ankle however she does have some dorsal lateral pain over her foot she is unsure whether whether she had any dedicated radiographs of her foot has pain with ambulation.  She is currently using Tylenol for pain Left Ankle Exam  Left ankle exam is normal.     Incisions are well-healed continues with moderate edema of the foot and ankle there is no erythema no warmth no sign of infection.  Tenderness over the fourth metatarsal   Visit Diagnoses:  1. Pain in left foot     Plan: Radiographs reassuring she will advance her weightbearing as tolerated.  May discontinue the cam walker.  Given an ASO.  She will follow-up as needed  Follow-Up Instructions: Return if symptoms worsen or fail to improve.   Imaging: XR Foot Complete Left  Result Date: 11/12/2019 Radiographs of the left foot negative for fracture.  No acute finding   Orders:  Orders Placed This Encounter  Procedures  . XR Foot Complete Left   No orders of the defined types were placed in this encounter.    PMFS History: Patient Active Problem List   Diagnosis Date Noted  . Closed left ankle fracture 10/02/2019  . Ankle dislocation, left, initial encounter   . Syndesmotic disruption of ankle, left, initial encounter   . Closed bimalleolar fracture of left ankle   . Syncope 08/07/2012  . Goiter 08/07/2012  . CELLULITIS/ABSCESS NOS 07/04/2009  . OTHER  DISEASES OF NASAL CAVITY AND SINUSES 06/02/2009  . OSTEOPOROSIS 07/03/2007   Past Medical History:  Diagnosis Date  . Anal fissure   . Anemia    in the past with surgery but no transfusion  . CELLULITIS/ABSCESS NOS 07/04/2009  . Chronic sore throat   . Diverticulosis   . Endometriosis   . Gallstones   . Hx of gastric ulcer   . Hypertension   . OSTEOPOROSIS 07/03/2007   osteopenia  . Other diseases of nasal cavity and sinuses(478.19) 06/02/2009  . Snores     Family History  Problem Relation Age of Onset  . Pancreatic cancer Mother   . Kidney cancer Mother   . Heart disease Father   . Lung cancer Father        smoker  . Prostate cancer Other   . Breast cancer Maternal Aunt        x 2  . Colon cancer Neg Hx   . Rectal cancer Neg Hx   . Stomach cancer Neg Hx   . Colon polyps Neg Hx   . Esophageal cancer Neg Hx     Past Surgical History:  Procedure Laterality Date  . BLADDER REPAIR  1993   post perforation during surgery for endometriosis  . BREAST BIOPSY  [id number]   rt and lt-negative  .  CESAREAN SECTION     x 2  . CHOLECYSTECTOMY  2000  . COLONOSCOPY  09-28-2004   tics only; 2016  . complete hysterectomy  1985   following partial hysterectomy  . DILATION AND CURETTAGE OF UTERUS    . LAPAROSCOPIC ENDOMETRIOSIS FULGURATION  1993   with subsequent bladder perforation  . ORIF ANKLE FRACTURE Left 10/03/2019   Procedure: OPEN REDUCTION INTERNAL FIXATION (ORIF) ANKLE FRACTURE;  Surgeon: Newt Minion, MD;  Location: [facility];  Service: Orthopedics;  Laterality: Left;  . PARTIAL HYSTERECTOMY    . ROTATOR CUFF REPAIR Right   . SHOULDER ARTHROSCOPY  2010   right  . TONSILLECTOMY AND ADENOIDECTOMY Bilateral 08/09/2013   Procedure: BILATERAL TONSILLECTOMY ;  Surgeon: Ascencion Dike, MD;  Location: [facility];  Service: ENT;  Laterality: Bilateral;   Social History   Occupational History  . Occupation: semi-retired  Tobacco Use  . Smoking status: Never Smoker  .  Smokeless tobacco: Never Used  Vaping Use  . Vaping Use: Never used  Substance and Sexual Activity  . Alcohol use: Yes    Alcohol/week: 0.0 standard drinks    Comment: occasionaly  . Drug use: No  . Sexual activity: Not on file

## 2019-11-15 ENCOUNTER — Telehealth: Payer: Self-pay | Admitting: Gastroenterology

## 2019-11-15 NOTE — Telephone Encounter (Signed)
Noted! Thank you

## 2019-11-16 ENCOUNTER — Encounter: Payer: Medicare Other | Admitting: Gastroenterology

## 2019-11-17 ENCOUNTER — Telehealth: Payer: Self-pay | Admitting: Orthopedic Surgery

## 2019-11-17 NOTE — Telephone Encounter (Signed)
LVM for [email or web address]  with verbal okay for physical therapy strengthening for patient.

## 2019-11-17 NOTE — Telephone Encounter (Signed)
Physical Therapist Clare Gandy from [email or web address]  called requesting a call back. Gershon Mussel stated he needs approval from Dr. Sharol Given to start left ankle strengthen with patient. Tom phone number is [phone].

## 2019-11-23 ENCOUNTER — Encounter: Payer: Self-pay | Admitting: Family

## 2019-11-26 ENCOUNTER — Telehealth: Payer: Self-pay | Admitting: Family

## 2019-11-26 ENCOUNTER — Ambulatory Visit (INDEPENDENT_AMBULATORY_CARE_PROVIDER_SITE_OTHER): Payer: Medicare Other | Admitting: Physician Assistant

## 2019-11-26 ENCOUNTER — Other Ambulatory Visit: Payer: Self-pay

## 2019-11-26 ENCOUNTER — Encounter: Payer: Self-pay | Admitting: Physician Assistant

## 2019-11-26 VITALS — Ht 60.0 in | Wt 170.0 lb

## 2019-11-26 DIAGNOSIS — S9305XA Dislocation of left ankle joint, initial encounter: Secondary | ICD-10-CM

## 2019-11-26 NOTE — Telephone Encounter (Signed)
Patient called. Says her left ankle is really swollen. Would like for Erin to call her. CB [phone]

## 2019-11-26 NOTE — Telephone Encounter (Signed)
Called pt and appt made for today at 1 pm to recheck ankle.

## 2019-11-26 NOTE — Progress Notes (Signed)
Office Visit Note   Patient: Jamie Jensen           Date of Birth: 03-21-1946           MRN: [id number] Visit Date: 11/26/2019              Requested by: Wayland Salinas, MD [address] [place],  [place] [id number] PCP: Wayland Salinas, MD  Chief Complaint  Patient presents with  . Left Ankle - Routine Post Op    10/03/19 ORIF left ankle fx       HPI: This is a pleasant woman who is 6 weeks status post ORIF of left ankle fracture.  She is complaining of some increased swelling in her ankle.  She denies any increased pain she denies any calf pain.  Her husband was in the hospital recently and she was up on her feet a lot more.  She was using a brace but she finds her tall cam boot more comfortable she denies any fever or chills  Assessment & Plan: Visit Diagnoses: No diagnosis found.  Plan: I would like for her to use a compression stocking.  I think this will help with some of the swelling.  She did say that physical therapy has been doing some modalities that help decrease the swelling and I think this is a good idea.  Follow-up at her next scheduled appointment  Follow-Up Instructions: No follow-ups on file.   Ortho Exam  Patient is alert, oriented, no adenopathy, well-dressed, normal affect, normal respiratory effort. Focused examination of her ankle demonstrates well-healed surgical incision.  Mild to moderate soft tissue swelling but no cellulitis.  No fluctuance.  No pain with ankle range of motion.  Compartments are soft and nontender she has negative Homans' sign distal CMS is intact  Imaging: No results found. No images are attached to the encounter.  Labs: No results found for: HGBA1C, ESRSEDRATE, CRP, LABURIC, REPTSTATUS, GRAMSTAIN, CULT, LABORGA   No results found for: ALBUMIN, PREALBUMIN, LABURIC  No results found for: MG No results found for: VD25OH  No results found for: PREALBUMIN CBC EXTENDED Latest Ref Rng & Units  10/03/2019 10/02/2019 08/09/2013  WBC 4.0 - 10.5 K/uL 12.2(H) 10.7(H) -  RBC 3.87 - 5.11 MIL/uL 4.27 4.53 -  HGB 12.0 - 15.0 g/dL 13.0 13.9 15.7(H)  HCT 36 - 46 % 40.0 41.5 -  PLT 150 - 400 K/uL 265 270 -  NEUTROABS 1.7 - 7.7 K/uL - 9.2(H) -  LYMPHSABS 0.7 - 4.0 K/uL - 0.9 -     Body mass index is 33.2 kg/m.  Orders:  No orders of the defined types were placed in this encounter.  No orders of the defined types were placed in this encounter.    Procedures: No procedures performed  Clinical Data: No additional findings.  ROS:  All other systems negative, except as noted in the HPI. Review of Systems  Objective: Vital Signs: Ht 5' (1.524 m)   Wt 170 lb (77.1 kg)   BMI 33.20 kg/m   Specialty Comments:  No specialty comments available.  PMFS History: Patient Active Problem List   Diagnosis Date Noted  . Closed left ankle fracture 10/02/2019  . Ankle dislocation, left, initial encounter   . Syndesmotic disruption of ankle, left, initial encounter   . Closed bimalleolar fracture of left ankle   . Syncope 08/07/2012  . Goiter 08/07/2012  . CELLULITIS/ABSCESS NOS 07/04/2009  . OTHER DISEASES OF NASAL CAVITY AND SINUSES  06/02/2009  . OSTEOPOROSIS 07/03/2007   Past Medical History:  Diagnosis Date  . Anal fissure   . Anemia    in the past with surgery but no transfusion  . CELLULITIS/ABSCESS NOS 07/04/2009  . Chronic sore throat   . Diverticulosis   . Endometriosis   . Gallstones   . Hx of gastric ulcer   . Hypertension   . OSTEOPOROSIS 07/03/2007   osteopenia  . Other diseases of nasal cavity and sinuses(478.19) 06/02/2009  . Snores     Family History  Problem Relation Age of Onset  . Pancreatic cancer Mother   . Kidney cancer Mother   . Heart disease Father   . Lung cancer Father        smoker  . Prostate cancer Other   . Breast cancer Maternal Aunt        x 2  . Colon cancer Neg Hx   . Rectal cancer Neg Hx   . Stomach cancer Neg Hx   . Colon polyps  Neg Hx   . Esophageal cancer Neg Hx     Past Surgical History:  Procedure Laterality Date  . BLADDER REPAIR  1993   post perforation during surgery for endometriosis  . BREAST BIOPSY  [id number]   rt and lt-negative  . CESAREAN SECTION     x 2  . CHOLECYSTECTOMY  2000  . COLONOSCOPY  09-28-2004   tics only; 2016  . complete hysterectomy  1985   following partial hysterectomy  . DILATION AND CURETTAGE OF UTERUS    . LAPAROSCOPIC ENDOMETRIOSIS FULGURATION  1993   with subsequent bladder perforation  . ORIF ANKLE FRACTURE Left 10/03/2019   Procedure: OPEN REDUCTION INTERNAL FIXATION (ORIF) ANKLE FRACTURE;  Surgeon: Newt Minion, MD;  Location: [facility];  Service: Orthopedics;  Laterality: Left;  . PARTIAL HYSTERECTOMY    . ROTATOR CUFF REPAIR Right   . SHOULDER ARTHROSCOPY  2010   right  . TONSILLECTOMY AND ADENOIDECTOMY Bilateral 08/09/2013   Procedure: BILATERAL TONSILLECTOMY ;  Surgeon: Ascencion Dike, MD;  Location: [facility];  Service: ENT;  Laterality: Bilateral;   Social History   Occupational History  . Occupation: semi-retired  Tobacco Use  . Smoking status: Never Smoker  . Smokeless tobacco: Never Used  Vaping Use  . Vaping Use: Never used  Substance and Sexual Activity  . Alcohol use: Yes    Alcohol/week: 0.0 standard drinks    Comment: occasionaly  . Drug use: No  . Sexual activity: Not on file

## 2019-12-01 ENCOUNTER — Telehealth: Payer: Self-pay | Admitting: Cardiovascular Disease

## 2019-12-01 NOTE — Telephone Encounter (Signed)
STAT if patient feels like he/she is going to faint   1) Are you dizzy now? No  2) Do you feel faint or have you passed out? No  3) Do you have any other symptoms? Headache / Palpitations / patient states BP has also been elevated however, she does not have any readings documented  4) Have you checked your HR and BP (record if available)? No   Patient c/o Palpitations:  High priority if patient c/o lightheadedness, shortness of breath, or chest pain  1) How long have you had palpitations/irregular HR/ Afib? Are you having the symptoms now? Palpitations - no palpitations currently  2) Are you currently experiencing lightheadedness, SOB or CP? No  3) Do you have a history of afib (atrial fibrillation) or irregular heart rhythm? No  4) Have you checked your BP or HR? (document readings if available): No readings available  5) Are you experiencing any other symptoms? No

## 2019-12-01 NOTE — Telephone Encounter (Signed)
Called patient back. Patient stated she broke her leg and ankle back on 4th of July. Patient has PT several times a week and they have been concerned about her BP 163/96. Patient stated she has had some palpitations, but right now she feels fine. Patient stated she does not have any pain when her BP is elevated. Patient stated she is going to see her PCP on Monday, but did not know if she should see Dr. Johnsie Cancel. Informed patient that first available is in a few weeks with APP. Informed patient that she should contact her PCP, and a message would be sent to Dr. Johnsie Cancel for further advisement.

## 2019-12-02 NOTE — Telephone Encounter (Signed)
Her primary can take care of her BP !!

## 2019-12-02 NOTE — Telephone Encounter (Signed)
Called patient and informed her to follow up with her PCP. Patient verbalized understanding.

## 2019-12-03 ENCOUNTER — Ambulatory Visit (INDEPENDENT_AMBULATORY_CARE_PROVIDER_SITE_OTHER): Payer: Medicare Other | Admitting: Physician Assistant

## 2019-12-03 ENCOUNTER — Encounter: Payer: Self-pay | Admitting: Physician Assistant

## 2019-12-03 ENCOUNTER — Other Ambulatory Visit: Payer: Self-pay

## 2019-12-03 VITALS — Ht 60.0 in | Wt 170.0 lb

## 2019-12-03 DIAGNOSIS — S9305XA Dislocation of left ankle joint, initial encounter: Secondary | ICD-10-CM

## 2019-12-03 NOTE — Progress Notes (Signed)
Office Visit Note   Patient: Jamie Jensen           Date of Birth: 11/13/1945           MRN: [id number] Visit Date: 12/03/2019              Requested by: Wayland Salinas, MD [address] [place],  [place] [address] PCP: Wayland Salinas, MD  Chief Complaint  Patient presents with  . Left Ankle - Routine Post Op    10/03/19 ORIF left ankle fracture.       HPI: The patient is 2 months status post ORIF of her left ankle.  She had some swelling because she was having to help her husband.  She has been wearing a compression stocking and feels much better.  She is in the process of weaning out of her boot and weaning to a cane   Assessment & Plan: Visit Diagnoses: No diagnosis found.  Plan: Patient would like to follow-up as needed.  She is going on a trip to the beach in September.  She understands if she has any concerns or issues she may contact us so we be happy to see her back  Follow-Up Instructions: No follow-ups on file.   Ortho Exam  Patient is alert, oriented, no adenopathy, well-dressed, normal affect, normal respiratory effort. Focused examination of her ankle demonstrates well-healed surgical incision.  Mild soft tissue swelling she has been wearing a compression stocking.  Much improved from previous visit ankle range of motion is painless and fairly good.  Compartments are soft and compressible no cellulitis no evidence of infection  Imaging: No results found. No images are attached to the encounter.  Labs: No results found for: HGBA1C, ESRSEDRATE, CRP, LABURIC, REPTSTATUS, GRAMSTAIN, CULT, LABORGA   No results found for: ALBUMIN, PREALBUMIN, LABURIC  No results found for: MG No results found for: VD25OH  No results found for: PREALBUMIN CBC EXTENDED Latest Ref Rng & Units 10/03/2019 10/02/2019 08/09/2013  WBC 4.0 - 10.5 K/uL 12.2(H) 10.7(H) -  RBC 3.87 - 5.11 MIL/uL 4.27 4.53 -  HGB 12.0 - 15.0 g/dL 13.0 13.9 15.7(H)  HCT 36 -  46 % 40.0 41.5 -  PLT 150 - 400 K/uL 265 270 -  NEUTROABS 1.7 - 7.7 K/uL - 9.2(H) -  LYMPHSABS 0.7 - 4.0 K/uL - 0.9 -     Body mass index is 33.2 kg/m.  Orders:  No orders of the defined types were placed in this encounter.  No orders of the defined types were placed in this encounter.    Procedures: No procedures performed  Clinical Data: No additional findings.  ROS:  All other systems negative, except as noted in the HPI. Review of Systems  Objective: Vital Signs: Ht 5' (1.524 m)   Wt 170 lb (77.1 kg)   BMI 33.20 kg/m   Specialty Comments:  No specialty comments available.  PMFS History: Patient Active Problem List   Diagnosis Date Noted  . Closed left ankle fracture 10/02/2019  . Ankle dislocation, left, initial encounter   . Syndesmotic disruption of ankle, left, initial encounter   . Closed bimalleolar fracture of left ankle   . Syncope 08/07/2012  . Goiter 08/07/2012  . CELLULITIS/ABSCESS NOS 07/04/2009  . OTHER DISEASES OF NASAL CAVITY AND SINUSES 06/02/2009  . OSTEOPOROSIS 07/03/2007   Past Medical History:  Diagnosis Date  . Anal fissure   . Anemia    in the past with surgery but no  transfusion  . CELLULITIS/ABSCESS NOS 07/04/2009  . Chronic sore throat   . Diverticulosis   . Endometriosis   . Gallstones   . Hx of gastric ulcer   . Hypertension   . OSTEOPOROSIS 07/03/2007   osteopenia  . Other diseases of nasal cavity and sinuses(478.19) 06/02/2009  . Snores     Family History  Problem Relation Age of Onset  . Pancreatic cancer Mother   . Kidney cancer Mother   . Heart disease Father   . Lung cancer Father        smoker  . Prostate cancer Other   . Breast cancer Maternal Aunt        x 2  . Colon cancer Neg Hx   . Rectal cancer Neg Hx   . Stomach cancer Neg Hx   . Colon polyps Neg Hx   . Esophageal cancer Neg Hx     Past Surgical History:  Procedure Laterality Date  . BLADDER REPAIR  1993   post perforation during surgery for  endometriosis  . BREAST BIOPSY  [id number]   rt and lt-negative  . CESAREAN SECTION     x 2  . CHOLECYSTECTOMY  2000  . COLONOSCOPY  09-28-2004   tics only; 2016  . complete hysterectomy  1985   following partial hysterectomy  . DILATION AND CURETTAGE OF UTERUS    . LAPAROSCOPIC ENDOMETRIOSIS FULGURATION  1993   with subsequent bladder perforation  . ORIF ANKLE FRACTURE Left 10/03/2019   Procedure: OPEN REDUCTION INTERNAL FIXATION (ORIF) ANKLE FRACTURE;  Surgeon: Newt Minion, MD;  Location: [facility];  Service: Orthopedics;  Laterality: Left;  . PARTIAL HYSTERECTOMY    . ROTATOR CUFF REPAIR Right   . SHOULDER ARTHROSCOPY  2010   right  . TONSILLECTOMY AND ADENOIDECTOMY Bilateral 08/09/2013   Procedure: BILATERAL TONSILLECTOMY ;  Surgeon: Ascencion Dike, MD;  Location: [facility];  Service: ENT;  Laterality: Bilateral;   Social History   Occupational History  . Occupation: semi-retired  Tobacco Use  . Smoking status: Never Smoker  . Smokeless tobacco: Never Used  Vaping Use  . Vaping Use: Never used  Substance and Sexual Activity  . Alcohol use: Yes    Alcohol/week: 0.0 standard drinks    Comment: occasionaly  . Drug use: No  . Sexual activity: Not on file

## 2019-12-17 ENCOUNTER — Encounter: Payer: Self-pay | Admitting: Orthopedic Surgery

## 2020-02-07 ENCOUNTER — Ambulatory Visit (INDEPENDENT_AMBULATORY_CARE_PROVIDER_SITE_OTHER): Payer: Medicare Other | Admitting: Physician Assistant

## 2020-02-07 ENCOUNTER — Encounter: Payer: Self-pay | Admitting: Physician Assistant

## 2020-02-07 VITALS — Ht 60.0 in | Wt 170.0 lb

## 2020-02-07 DIAGNOSIS — S9305XA Dislocation of left ankle joint, initial encounter: Secondary | ICD-10-CM

## 2020-02-07 NOTE — Progress Notes (Signed)
Office Visit Note   Patient: Jamie Jensen           Date of Birth: [birth year]           MRN: [id number] Visit Date: 02/07/2020              Requested by: Wayland Salinas, MD [address] [place],  [place] [address] PCP: Wayland Salinas, MD  Chief Complaint  Patient presents with  . Left Ankle - Follow-up    Left ankle ORIF 10/03/2019      HPI: The patient is a pleasant 74 year old woman who is 4 months status post open reduction internal fixation of her left ankle bimalleolar fracture and syndesmosis disruption.  She is doing very well.  She was recently at the beach walking on the sand.  She reports having some more swelling and her husband was concerned about it.  Assessment & Plan: Visit Diagnoses: No diagnosis found.  Plan: Continue with low impact exercise.  She understands she may continue to have swelling in her ankle especially after activities.  Elevating and icing helps her a bunch and I think this is a good idea follow-up as needed she will continue to use her compression stocking  Follow-Up Instructions: No follow-ups on file.   Ortho Exam  Patient is alert, oriented, no adenopathy, well-dressed, normal affect, normal respiratory effort. Left ankle well-healed surgical incisions.  She still has some mild tenderness over the medial side of the ankle.  Excellent range of motion good plantar flexion dorsiflexion eversion and inversion strength.  Mild soft tissue swelling no cellulitis compartments are soft and nontender  Imaging: No results found. No images are attached to the encounter.  Labs: No results found for: HGBA1C, ESRSEDRATE, CRP, LABURIC, REPTSTATUS, GRAMSTAIN, CULT, LABORGA   No results found for: ALBUMIN, PREALBUMIN, LABURIC  No results found for: MG No results found for: VD25OH  No results found for: PREALBUMIN CBC EXTENDED Latest Ref Rng & Units 10/03/2019 10/02/2019 08/09/2013  WBC 4.0 - 10.5 K/uL 12.2(H) 10.7(H) -   RBC 3.87 - 5.11 MIL/uL 4.27 4.53 -  HGB 12.0 - 15.0 g/dL 13.0 13.9 15.7(H)  HCT 36 - 46 % 40.0 41.5 -  PLT 150 - 400 K/uL 265 270 -  NEUTROABS 1.7 - 7.7 K/uL - 9.2(H) -  LYMPHSABS 0.7 - 4.0 K/uL - 0.9 -     Body mass index is 33.2 kg/m.  Orders:  No orders of the defined types were placed in this encounter.  No orders of the defined types were placed in this encounter.    Procedures: No procedures performed  Clinical Data: No additional findings.  ROS:  All other systems negative, except as noted in the HPI. Review of Systems  Objective: Vital Signs: Ht 5' (1.524 m)   Wt 170 lb (77.1 kg)   BMI 33.20 kg/m   Specialty Comments:  No specialty comments available.  PMFS History: Patient Active Problem List   Diagnosis Date Noted  . Closed left ankle fracture 10/02/2019  . Ankle dislocation, left, initial encounter   . Syndesmotic disruption of ankle, left, initial encounter   . Closed bimalleolar fracture of left ankle   . Syncope 08/07/2012  . Goiter 08/07/2012  . CELLULITIS/ABSCESS NOS 07/04/2009  . OTHER DISEASES OF NASAL CAVITY AND SINUSES 06/02/2009  . OSTEOPOROSIS 07/03/2007   Past Medical History:  Diagnosis Date  . Anal fissure   . Anemia    in the past with surgery but no  transfusion  . CELLULITIS/ABSCESS NOS 07/04/2009  . Chronic sore throat   . Diverticulosis   . Endometriosis   . Gallstones   . Hx of gastric ulcer   . Hypertension   . OSTEOPOROSIS 07/03/2007   osteopenia  . Other diseases of nasal cavity and sinuses(478.19) 06/02/2009  . Snores     Family History  Problem Relation Age of Onset  . Pancreatic cancer Mother   . Kidney cancer Mother   . Heart disease Father   . Lung cancer Father        smoker  . Prostate cancer Other   . Breast cancer Maternal Aunt        x 2  . Colon cancer Neg Hx   . Rectal cancer Neg Hx   . Stomach cancer Neg Hx   . Colon polyps Neg Hx   . Esophageal cancer Neg Hx     Past Surgical History:   Procedure Laterality Date  . BLADDER REPAIR  1993   post perforation during surgery for endometriosis  . BREAST BIOPSY  [id number]   rt and lt-negative  . CESAREAN SECTION     x 2  . CHOLECYSTECTOMY  2000  . COLONOSCOPY  09-28-2004   tics only; 2016  . complete hysterectomy  1985   following partial hysterectomy  . DILATION AND CURETTAGE OF UTERUS    . LAPAROSCOPIC ENDOMETRIOSIS FULGURATION  1993   with subsequent bladder perforation  . ORIF ANKLE FRACTURE Left 10/03/2019   Procedure: OPEN REDUCTION INTERNAL FIXATION (ORIF) ANKLE FRACTURE;  Surgeon: Newt Minion, MD;  Location: [facility];  Service: Orthopedics;  Laterality: Left;  . PARTIAL HYSTERECTOMY    . ROTATOR CUFF REPAIR Right   . SHOULDER ARTHROSCOPY  2010   right  . TONSILLECTOMY AND ADENOIDECTOMY Bilateral 08/09/2013   Procedure: BILATERAL TONSILLECTOMY ;  Surgeon: Ascencion Dike, MD;  Location: [facility];  Service: ENT;  Laterality: Bilateral;   Social History   Occupational History  . Occupation: semi-retired  Tobacco Use  . Smoking status: Never Smoker  . Smokeless tobacco: Never Used  Vaping Use  . Vaping Use: Never used  Substance and Sexual Activity  . Alcohol use: Yes    Alcohol/week: 0.0 standard drinks    Comment: occasionaly  . Drug use: No  . Sexual activity: Not on file

## 2020-02-28 ENCOUNTER — Encounter: Payer: Self-pay | Admitting: Internal Medicine

## 2020-05-03 ENCOUNTER — Encounter: Payer: Medicare Other | Admitting: Internal Medicine

## 2020-06-19 ENCOUNTER — Ambulatory Visit (INDEPENDENT_AMBULATORY_CARE_PROVIDER_SITE_OTHER): Payer: Medicare Other | Admitting: Orthopedic Surgery

## 2020-06-19 ENCOUNTER — Ambulatory Visit (INDEPENDENT_AMBULATORY_CARE_PROVIDER_SITE_OTHER): Payer: Medicare Other

## 2020-06-19 ENCOUNTER — Encounter: Payer: Self-pay | Admitting: Physician Assistant

## 2020-06-19 VITALS — Ht 60.0 in | Wt 170.0 lb

## 2020-06-19 DIAGNOSIS — M25572 Pain in left ankle and joints of left foot: Secondary | ICD-10-CM

## 2020-06-20 ENCOUNTER — Encounter: Payer: Self-pay | Admitting: Physician Assistant

## 2020-06-20 NOTE — Progress Notes (Signed)
Office Visit Note   Patient: Jamie Jensen           Date of Birth: 1946-01-24           MRN: [id number] Visit Date: 06/19/2020              Requested by: Wayland Salinas, MD [address] [place],  [place] [address] PCP: Wayland Salinas, MD  Chief Complaint  Patient presents with  . Left Foot - Follow-up      HPI: Patient is a 75 year old woman who presents complaining that her left foot great toe MTP joint locks and pops she is having difficulty with walking she is status post ankle reconstruction for bimalleolar fracture with syndesmotic injury.  Assessment & Plan: Visit Diagnoses:  1. Pain in left ankle and joints of left foot     Plan: Recommended Achilles stretching this was demonstrated recommended fascial strengthening and stiff sneakers. Discussed that if she fails conservative treatment of fusion of the great toe MTP joint is an option.  Follow-Up Instructions: Return in about 4 weeks (around 07/17/2020).   Ortho Exam  Patient is alert, oriented, no adenopathy, well-dressed, normal affect, normal respiratory effort. Examination patient has a good dorsalis pedis pulse with her knee extended she has dorsiflexion only to neutral. She has good range of motion of the ankle she has pain to palpation at the great toe MTP joint occasionally has some pain to the base of the first metatarsal patient can do a single limb heel raise and stand on her toes she has good posterior tibial function. Her midfoot pain seems to be coming from the Achilles tightness.  Imaging: XR Ankle 2 Views Left  Result Date: 06/20/2020 2 view radiographs of the left ankle shows stable internal fixation for the bimalleolar fracture and syndesmosis injury. There is lucency around the syndesmotic screws the mortise is congruent  XR Foot 2 Views Left  Result Date: 06/20/2020 2 view radiographs of the left foot shows arthritic changes great toe MTP joint with subcondylar  sclerosis and cysts.  No images are attached to the encounter.  Labs: No results found for: HGBA1C, ESRSEDRATE, CRP, LABURIC, REPTSTATUS, GRAMSTAIN, CULT, LABORGA   No results found for: ALBUMIN, PREALBUMIN, LABURIC  No results found for: MG No results found for: VD25OH  No results found for: PREALBUMIN CBC EXTENDED Latest Ref Rng & Units 10/03/2019 10/02/2019 08/09/2013  WBC 4.0 - 10.5 K/uL 12.2(H) 10.7(H) -  RBC 3.87 - 5.11 MIL/uL 4.27 4.53 -  HGB 12.0 - 15.0 g/dL 13.0 13.9 15.7(H)  HCT 36.0 - 46.0 % 40.0 41.5 -  PLT 150 - 400 K/uL 265 270 -  NEUTROABS 1.7 - 7.7 K/uL - 9.2(H) -  LYMPHSABS 0.7 - 4.0 K/uL - 0.9 -     Body mass index is 33.2 kg/m.  Orders:  Orders Placed This Encounter  Procedures  . XR Foot 2 Views Left  . XR Ankle 2 Views Left   No orders of the defined types were placed in this encounter.    Procedures: No procedures performed  Clinical Data: No additional findings.  ROS:  All other systems negative, except as noted in the HPI. Review of Systems  Objective: Vital Signs: Ht 5' (1.524 m)   Wt 170 lb (77.1 kg)   BMI 33.20 kg/m   Specialty Comments:  No specialty comments available.  PMFS History: Patient Active Problem List   Diagnosis Date Noted  . Closed left ankle fracture 10/02/2019  .  Ankle dislocation, left, initial encounter   . Syndesmotic disruption of ankle, left, initial encounter   . Closed bimalleolar fracture of left ankle   . Syncope 08/07/2012  . Goiter 08/07/2012  . CELLULITIS/ABSCESS NOS 07/04/2009  . OTHER DISEASES OF NASAL CAVITY AND SINUSES 06/02/2009  . OSTEOPOROSIS 07/03/2007   Past Medical History:  Diagnosis Date  . Anal fissure   . Anemia    in the past with surgery but no transfusion  . CELLULITIS/ABSCESS NOS 07/04/2009  . Chronic sore throat   . Diverticulosis   . Endometriosis   . Gallstones   . Hx of gastric ulcer   . Hypertension   . OSTEOPOROSIS 07/03/2007   osteopenia  . Other diseases of  nasal cavity and sinuses(478.19) 06/02/2009  . Snores     Family History  Problem Relation Age of Onset  . Pancreatic cancer Mother   . Kidney cancer Mother   . Heart disease Father   . Lung cancer Father        smoker  . Prostate cancer Other   . Breast cancer Maternal Aunt        x 2  . Colon cancer Neg Hx   . Rectal cancer Neg Hx   . Stomach cancer Neg Hx   . Colon polyps Neg Hx   . Esophageal cancer Neg Hx     Past Surgical History:  Procedure Laterality Date  . BLADDER REPAIR  1993   post perforation during surgery for endometriosis  . BREAST BIOPSY  [id number]   rt and lt-negative  . CESAREAN SECTION     x 2  . CHOLECYSTECTOMY  2000  . COLONOSCOPY  09-28-2004   tics only; 2016  . complete hysterectomy  1985   following partial hysterectomy  . DILATION AND CURETTAGE OF UTERUS    . LAPAROSCOPIC ENDOMETRIOSIS FULGURATION  1993   with subsequent bladder perforation  . ORIF ANKLE FRACTURE Left 10/03/2019   Procedure: OPEN REDUCTION INTERNAL FIXATION (ORIF) ANKLE FRACTURE;  Surgeon: Newt Minion, MD;  Location: [facility];  Service: Orthopedics;  Laterality: Left;  . PARTIAL HYSTERECTOMY    . ROTATOR CUFF REPAIR Right   . SHOULDER ARTHROSCOPY  2010   right  . TONSILLECTOMY AND ADENOIDECTOMY Bilateral 08/09/2013   Procedure: BILATERAL TONSILLECTOMY ;  Surgeon: Ascencion Dike, MD;  Location: [facility];  Service: ENT;  Laterality: Bilateral;   Social History   Occupational History  . Occupation: semi-retired  Tobacco Use  . Smoking status: Never Smoker  . Smokeless tobacco: Never Used  Vaping Use  . Vaping Use: Never used  Substance and Sexual Activity  . Alcohol use: Yes    Alcohol/week: 0.0 standard drinks    Comment: occasionaly  . Drug use: No  . Sexual activity: Not on file

## 2021-06-14 ENCOUNTER — Encounter: Payer: Self-pay | Admitting: Orthopedic Surgery

## 2021-06-19 ENCOUNTER — Ambulatory Visit (INDEPENDENT_AMBULATORY_CARE_PROVIDER_SITE_OTHER): Payer: Medicare Other

## 2021-06-19 ENCOUNTER — Other Ambulatory Visit: Payer: Self-pay

## 2021-06-19 ENCOUNTER — Encounter: Payer: Self-pay | Admitting: Orthopedic Surgery

## 2021-06-19 ENCOUNTER — Ambulatory Visit (INDEPENDENT_AMBULATORY_CARE_PROVIDER_SITE_OTHER): Payer: Medicare Other | Admitting: Orthopedic Surgery

## 2021-06-19 DIAGNOSIS — M25572 Pain in left ankle and joints of left foot: Secondary | ICD-10-CM

## 2021-06-19 NOTE — Progress Notes (Signed)
Office Visit Note   Patient: Jamie Jensen           Date of Birth: 04/10/1946           MRN: [id number] Visit Date: 06/19/2021              Requested by: Wayland Salinas, MD [address] [place],  [place] [address] PCP: Wayland Salinas, MD  Chief Complaint  Patient presents with   Left Leg - Pain   Left Ankle - Pain    S/p ORIF left ankle Fx 10/03/19      HPI: Patient is a 76 year old woman who is status post ankle fracture internal fixation in May 2021.  Patient complains of anterior ankle pain that radiates up to the mid shin.  She states she has clicking when she turns her foot a certain way.  Assessment & Plan: Visit Diagnoses:  1. Pain in left ankle and joints of left foot     Plan: Discussed options with a steroid injection that would be diagnostic and therapeutic versus strength training.  Patient would like to proceed with strength training she is given a prescription to go to [facility] for strengthening and range of motion of the ankle.  Follow-Up Instructions: Return in about 4 weeks (around 07/17/2021).   Ortho Exam  Patient is alert, oriented, no adenopathy, well-dressed, normal affect, normal respiratory effort. Examination patient's radiographs show a congruent mortise there is no redness no cellulitis she does have some tenderness to palpation anteriorly over the ankle there is no pain with subtalar motion.  Patient does have Achilles tightness with dorsiflexion to neutral she states her foot is painful when walking barefoot over the lateral side of her foot most likely from the Achilles tightness.  Imaging: XR Ankle Complete Left  Result Date: 06/19/2021 Three-view radiographs of the left ankle shows a congruent mortise.  She has intact hardware from a previous Weber C fibular fracture and internal fixation for syndesmotic repair.  XR Foot Complete Left  Result Date: 06/19/2021 Three-view radiographs of the left foot  shows no evidence of a fracture no joint space subluxation.  No images are attached to the encounter.  Labs: No results found for: HGBA1C, ESRSEDRATE, CRP, LABURIC, REPTSTATUS, GRAMSTAIN, CULT, LABORGA   No results found for: ALBUMIN, PREALBUMIN, CBC  No results found for: MG No results found for: VD25OH  No results found for: PREALBUMIN CBC EXTENDED Latest Ref Rng & Units 10/03/2019 10/02/2019 08/09/2013  WBC 4.0 - 10.5 K/uL 12.2(H) 10.7(H) -  RBC 3.87 - 5.11 MIL/uL 4.27 4.53 -  HGB 12.0 - 15.0 g/dL 13.0 13.9 15.7(H)  HCT 36.0 - 46.0 % 40.0 41.5 -  PLT 150 - 400 K/uL 265 270 -  NEUTROABS 1.7 - 7.7 K/uL - 9.2(H) -  LYMPHSABS 0.7 - 4.0 K/uL - 0.9 -     There is no height or weight on file to calculate BMI.  Orders:  Orders Placed This Encounter  Procedures   XR Foot Complete Left   XR Ankle Complete Left   No orders of the defined types were placed in this encounter.    Procedures: No procedures performed  Clinical Data: No additional findings.  ROS:  All other systems negative, except as noted in the HPI. Review of Systems  Objective: Vital Signs: There were no vitals taken for this visit.  Specialty Comments:  No specialty comments available.  PMFS History: Patient Active Problem List   Diagnosis Date  Noted   Closed left ankle fracture 10/02/2019   Ankle dislocation, left, initial encounter    Syndesmotic disruption of ankle, left, initial encounter    Closed bimalleolar fracture of left ankle    Syncope 08/07/2012   Goiter 08/07/2012   CELLULITIS/ABSCESS NOS 07/04/2009   OTHER DISEASES OF NASAL CAVITY AND SINUSES 06/02/2009   OSTEOPOROSIS 07/03/2007   Past Medical History:  Diagnosis Date   Anal fissure    Anemia    in the past with surgery but no transfusion   CELLULITIS/ABSCESS NOS 07/04/2009   Chronic sore throat    Diverticulosis    Endometriosis    Gallstones    Hx of gastric ulcer    Hypertension    OSTEOPOROSIS 07/03/2007   osteopenia    Other diseases of nasal cavity and sinuses(478.19) 06/02/2009   Snores     Family History  Problem Relation Age of Onset   Pancreatic cancer Mother    Kidney cancer Mother    Heart disease Father    Lung cancer Father        smoker   Prostate cancer Other    Breast cancer Maternal Aunt        x 2   Colon cancer Neg Hx    Rectal cancer Neg Hx    Stomach cancer Neg Hx    Colon polyps Neg Hx    Esophageal cancer Neg Hx     Past Surgical History:  Procedure Laterality Date   BLADDER REPAIR  1993   post perforation during surgery for endometriosis   BREAST BIOPSY  4709,6283   rt and lt-negative   CESAREAN SECTION     x 2   CHOLECYSTECTOMY  2000   COLONOSCOPY  09-28-2004   tics only; 2016   complete hysterectomy  1985   following partial hysterectomy   DILATION AND CURETTAGE OF UTERUS     LAPAROSCOPIC ENDOMETRIOSIS FULGURATION  1993   with subsequent bladder perforation   ORIF ANKLE FRACTURE Left 10/03/2019   Procedure: OPEN REDUCTION INTERNAL FIXATION (ORIF) ANKLE FRACTURE;  Surgeon: Newt Minion, MD;  Location: [facility];  Service: Orthopedics;  Laterality: Left;   PARTIAL HYSTERECTOMY     ROTATOR CUFF REPAIR Right    SHOULDER ARTHROSCOPY  2010   right   TONSILLECTOMY AND ADENOIDECTOMY Bilateral 08/09/2013   Procedure: BILATERAL TONSILLECTOMY ;  Surgeon: Ascencion Dike, MD;  Location: [facility];  Service: ENT;  Laterality: Bilateral;   Social History   Occupational History   Occupation: semi-retired  Tobacco Use   Smoking status: Never   Smokeless tobacco: Never  Vaping Use   Vaping Use: Never used  Substance and Sexual Activity   Alcohol use: Yes    Alcohol/week: 0.0 standard drinks    Comment: occasionaly   Drug use: No   Sexual activity: Not on file

## 2021-07-17 ENCOUNTER — Ambulatory Visit: Payer: Medicare Other | Admitting: Orthopedic Surgery

## 2022-03-19 ENCOUNTER — Other Ambulatory Visit: Payer: Self-pay | Admitting: Obstetrics and Gynecology

## 2022-03-19 DIAGNOSIS — R1031 Right lower quadrant pain: Secondary | ICD-10-CM

## 2022-04-19 ENCOUNTER — Other Ambulatory Visit: Payer: Medicare Other

## 2022-05-07 ENCOUNTER — Ambulatory Visit
Admission: RE | Admit: 2022-05-07 | Discharge: 2022-05-07 | Disposition: A | Discharge status: Home / Self Care | Payer: Medicare Other | Source: Ambulatory Visit | Attending: Obstetrics and Gynecology | Admitting: Obstetrics and Gynecology

## 2022-05-07 DIAGNOSIS — R1031 Right lower quadrant pain: Secondary | ICD-10-CM

## 2022-05-07 MED ORDER — IOPAMIDOL (ISOVUE-300) INJECTION 61%
100.0000 mL | Freq: Once | INTRAVENOUS | Status: AC | PRN
  Administered 2022-05-07: 100 mL via INTRAVENOUS

## 2023-05-05 NOTE — Progress Notes (Signed)
 CARDIOLOGY CONSULT NOTE       Patient ID: Jamie Jensen MRN: [id number] DOB/AGE: [birth year] 77 y.o.  Referring Physician: [place] Primary Physician: Clarance Joen HERO, MD Primary Cardiologist: New Reason for Consultation: Chest pain    HPI:  77 y.o. with history of HTN, obesity, vasovagal syncope, Goiter. Previous atypical chest pain. Last seen by us  in 2019. Had normal stress echo in 2014 ECG chronically abnormal with LAD and poor R wave progression. When I saw her in 2019 was concerned about floaters in eyes and her carotid duplex had minor plaque with no stenosis. She only takes diuretic for HTN. She has fractured her right ankle in past with surgical repair and sees Dr Harden. Seen at urgent care September of this year with sternal pain radiating to RLQ abdomen Rx with Bentyl ? Irritable bowel   Lab review from primary with LDL 75 Office visit noted BP controlled and class 2 severe obesity   ECG chronically abnormal with poor R wave progression ICLBBB   CT abdomen ? Appendicitis done 05/09/22 Saw general surgery acutely but not appendicitis ? Scar tissue From prior endometrial surgery   Her husband has refractory trigeminal neuralgia and is a lot of work. Still strained relations with her 2 step children. 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 Jamie Jensen   ROS All other systems reviewed and negative except as noted above  Past Medical History:  Diagnosis Date   Anal fissure    Anemia    in the past with surgery but no transfusion   CELLULITIS/ABSCESS NOS 07/04/2009   Chronic sore throat    Diverticulosis    Endometriosis    Gallstones    Hx of gastric ulcer    Hypertension    OSTEOPOROSIS 07/03/2007   osteopenia   Other diseases of nasal cavity and sinuses(478.19) 06/02/2009   Snores     Family History  Problem Relation Age of Onset   Pancreatic cancer Mother    Kidney cancer Mother    Heart disease  Father    Lung cancer Father        smoker   Prostate cancer Other    Breast cancer Maternal Aunt        x 2   Colon cancer Neg Hx    Rectal cancer Neg Hx    Stomach cancer Neg Hx    Colon polyps Neg Hx    Esophageal cancer Neg Hx     Social History   Socioeconomic History   Marital status: Married    Spouse name: Not on file   Number of children: 2   Years of education: Not on file   Highest education level: Not on file  Occupational History   Occupation: semi-retired  Tobacco Use   Smoking status: Never   Smokeless tobacco: Never  Vaping Use   Vaping status: Never Used  Substance and Sexual Activity   Alcohol use: Yes    Alcohol/week: 0.0 standard drinks of alcohol    Comment: occasionaly   Drug use: No   Sexual activity: Not on file  Other Topics Concern   Not on file  Social History Narrative   Not on file   Social Drivers of Health   Financial Resource Strain: Low Risk  (10/12/2022)   Received from [facility], [facility]   Overall Financial Resource Strain (CARDIA)    Difficulty of Paying Living Expenses: Not hard at all  Food Insecurity: No Food Insecurity (10/12/2022)   Received from [facility], [facility]   Hunger Vital Sign    Worried About Running Out of Food in the Last Year: Never true    Ran Out of Food in the Last Year: Never true  Transportation Needs: No Transportation Needs (10/12/2022)   Received from [facility]   [facility] - Transportation    Lack of Transportation (Medical): No    Lack of Transportation (Non-Medical): No  Physical Activity: Insufficiently Active (10/12/2022)   Received from [facility], [facility]   Exercise Vital Sign    Days of Exercise per Week: 4 days    Minutes of Exercise per Session: 30 min  Stress: No Stress Concern Present (10/12/2022)   Received from [facility], [facility] - Occupational Stress Questionnaire    Feeling of Stress : Not at  all  Social Connections: Moderately Integrated (10/12/2022)   Received from [facility], [facility]   Social Network    How would you rate your social network (family, work, friends)?: Adequate participation with social networks  Intimate Partner Violence: Not At Risk (10/12/2022)   Received from [facility], [facility]   HITS    Over the last 12 months how often did your partner physically hurt you?: Never    Over the last 12 months how often did your partner insult you or talk down to you?: Rarely    Over the last 12 months how often did your partner threaten you with physical harm?: Never    Over the last 12 months how often did your partner scream or curse at you?: Rarely    Past Surgical History:  Procedure Laterality Date   BLADDER REPAIR  1993   post perforation during surgery for endometriosis   BREAST BIOPSY  8003,8000   rt and lt-negative   CESAREAN SECTION     x 2   CHOLECYSTECTOMY  2000   COLONOSCOPY  09-28-2004   tics only; 2016   complete hysterectomy  1985   following partial hysterectomy   DILATION AND CURETTAGE OF UTERUS     LAPAROSCOPIC ENDOMETRIOSIS FULGURATION  1993   with subsequent bladder perforation   ORIF ANKLE FRACTURE Left 10/03/2019   Procedure: OPEN REDUCTION INTERNAL FIXATION (ORIF) ANKLE FRACTURE;  Surgeon: Harden Jerona GAILS, MD;  Location: [facility] OR;  Service: Orthopedics;  Laterality: Left;   PARTIAL HYSTERECTOMY     ROTATOR CUFF REPAIR Right    SHOULDER ARTHROSCOPY  2010   right   TONSILLECTOMY AND ADENOIDECTOMY Bilateral 08/09/2013   Procedure: BILATERAL TONSILLECTOMY ;  Surgeon: Ana LELON Moccasin, MD;  Location: [facility];  Service: ENT;  Laterality: Bilateral;      Current Outpatient Medications:    cholecalciferol (VITAMIN D3) 25 MCG (1000 UNIT) tablet, Take 1,000 Units by mouth daily., Disp: , Rfl:    Multiple Vitamins-Minerals (MULTIVITAMIN WITH MINERALS) tablet, Take 1 tablet by mouth daily., Disp: , Rfl:     triamterene-hydrochlorothiazide (DYAZIDE) 37.5-25 MG per capsule, Take 1 capsule by mouth every morning., Disp: , Rfl:     Physical Exam: Blood pressure 136/84, pulse (!) 102, resp. rate 16, height 5' (1.524 m), weight 183 lb (83 kg), SpO2 95%.    Affect appropriate Healthy:  appears stated age HEENT: normal Neck supple with no adenopathy JVP normal no bruits no thyromegaly Lungs clear with no wheezing and good diaphragmatic motion Heart:  S1/S2 no murmur, no rub,  gallop or click PMI normal Abdomen: benighn, BS positve, no tenderness, no AAA no bruit.  No HSM or HJR Distal pulses intact with no bruits No edema Neuro non-focal Prior right ankle fracture with decreased ROM   Labs:   Lab Results  Component Value Date   WBC 12.2 (H) 10/03/2019   HGB 13.0 10/03/2019   HCT 40.0 10/03/2019   MCV 93.7 10/03/2019   PLT 265 10/03/2019   No results for input(s): NA, K, CL, CO2, BUN, CREATININE, CALCIUM , PROT, BILITOT, ALKPHOS, ALT, AST, GLUCOSE in the last 168 hours.  Invalid input(s): LABALBU No results found for: CKTOTAL, CKMB, CKMBINDEX, TROPONINI No results found for: CHOL No results found for: HDL No results found for: LDLCALC No results found for: TRIG No results found for: CHOLHDL No results found for: LDLDIRECT    Radiology: No results found.  EKG: 2019 SR rate 89 LAD poor R wave progression  05/14/2023 SR rate 98 PVC ICLBBB poor R wave progression    ASSESSMENT AND PLAN:   HTN;  historically on diuretic and controlled  Chest Pain:  chronically abnormal ECG with LAD poor R wave progression Normal stress echo 2014 She has rare stabbing chest wall pain. Risk stratify with calcium  score  Ortho:  prior left ankle fracture with surgery F/U Duda NSAI's as needed  Abnormal ECG: ICLBBB poor R wave progression TTE to r/o structural heart dx  TTE Calcium  score  F/U  in a year pending tests   Signed: Maude Emmer 05/14/2023,  3:47 PM

## 2023-05-14 ENCOUNTER — Encounter: Payer: Self-pay | Admitting: Cardiovascular Disease

## 2023-05-14 ENCOUNTER — Ambulatory Visit: Payer: Medicare Other | Attending: Cardiovascular Disease | Admitting: Cardiovascular Disease

## 2023-05-14 VITALS — BP 136/84 | HR 102 | Resp 16 | Ht 60.0 in | Wt 183.0 lb

## 2023-05-14 DIAGNOSIS — S82892D Other fracture of left lower leg, subsequent encounter for closed fracture with routine healing: Secondary | ICD-10-CM

## 2023-05-14 DIAGNOSIS — I10 Essential (primary) hypertension: Secondary | ICD-10-CM

## 2023-05-14 DIAGNOSIS — R0789 Other chest pain: Secondary | ICD-10-CM | POA: Diagnosis present

## 2023-05-14 NOTE — Patient Instructions (Signed)
 Medication Instructions:  Your physician recommends that you continue on your current medications as directed. Please refer to the Current Medication list given to you today.  *If you need a refill on your cardiac medications before your next appointment, please call your pharmacy*   Lab Work: If you have labs (blood work) drawn today and your tests are completely normal, you will receive your results only by: MyChart Message (if you have MyChart) OR A paper copy in the mail If you have any lab test that is abnormal or we need to change your treatment, we will call you to review the results.   Testing/Procedures: Your physician has requested that you have an echocardiogram. Echocardiography is a painless test that uses sound waves to create images of your heart. It provides your doctor with information about the size and shape of your heart and how well your heart's chambers and valves are working. This procedure takes approximately one hour. There are no restrictions for this procedure. Please do NOT wear cologne, perfume, aftershave, or lotions (deodorant is allowed). Please arrive 15 minutes prior to your appointment time.  Please note: We ask at that you not bring children with you during ultrasound (echo/ vascular) testing. Due to room size and safety concerns, children are not allowed in the ultrasound rooms during exams. Our front office staff cannot provide observation of children in our lobby area while testing is being conducted. An adult accompanying a patient to their appointment will only be allowed in the ultrasound room at the discretion of the ultrasound technician under special circumstances. We apologize for any inconvenience.  Your physician has requested that you have a coronary calcium  score performed. This is not covered by insurance and will be an out-of-pocket cost of approximately $99.    Follow-Up: At [facility], you and your health needs are our priority.   As part of our continuing mission to provide you with exceptional heart care, we have created designated Provider Care Teams.  These Care Teams include your primary Cardiologist (physician) and Advanced Practice Providers (APPs -  Physician Assistants and Nurse Practitioners) who all work together to provide you with the care you need, when you need it.  We recommend signing up for the patient portal called MyChart.  Sign up information is provided on this After Visit Summary.  MyChart is used to connect with patients for Virtual Visits (Telemedicine).  Patients are able to view lab/test results, encounter notes, upcoming appointments, etc.  Non-urgent messages can be sent to your provider as well.   To learn more about what you can do with MyChart, go to [email or web address].    Your next appointment:   1 year(s)  Provider:   Maude Emmer, MD

## 2023-05-20 ENCOUNTER — Ambulatory Visit (HOSPITAL_COMMUNITY): Payer: Medicare Other | Attending: Cardiovascular Disease

## 2023-05-20 DIAGNOSIS — R0789 Other chest pain: Secondary | ICD-10-CM | POA: Diagnosis not present

## 2023-05-20 LAB — ECHOCARDIOGRAM COMPLETE
Area-P 1/2: 3.84 cm2
S' Lateral: 2.5 cm

## 2023-05-21 ENCOUNTER — Ambulatory Visit (HOSPITAL_COMMUNITY)
Admission: RE | Admit: 2023-05-21 | Discharge: 2023-05-21 | Disposition: A | Discharge status: Home / Self Care | Payer: Medicare Other | Source: Ambulatory Visit | Attending: Cardiovascular Disease | Admitting: Cardiovascular Disease

## 2023-05-21 ENCOUNTER — Telehealth: Payer: Self-pay

## 2023-05-21 DIAGNOSIS — R072 Precordial pain: Secondary | ICD-10-CM

## 2023-05-21 DIAGNOSIS — R0789 Other chest pain: Secondary | ICD-10-CM | POA: Insufficient documentation

## 2023-05-21 MED ORDER — ATORVASTATIN CALCIUM 10 MG PO TABS: 10.0000 mg | ORAL_TABLET | Freq: Every day | ORAL | 3 refills | Status: DC

## 2023-05-21 NOTE — Telephone Encounter (Signed)
 Patient aware of results. Placed order for lipitor and lab work, and lexiscan .

## 2023-05-21 NOTE — Telephone Encounter (Signed)
-----   Message from Janelle Mediate sent at 05/21/2023  6:18 PM EST ----- Calcium  score above average for age start lipitor 10 mg daily and repeat labs in 3 months Can order lexiscan  myovue for her atypical chest pain

## 2023-05-22 ENCOUNTER — Telehealth: Payer: Self-pay

## 2023-05-22 DIAGNOSIS — R079 Chest pain, unspecified: Secondary | ICD-10-CM

## 2023-05-22 NOTE — Telephone Encounter (Signed)
-----   Message from Donnie Coffin sent at 05/22/2023  7:53 AM EST ----- Please create an Order for an ATTESTATION ( [id number]) for ordered Myoview/GXT/Stress Echocardiogram.  This must be signed by ordering Provider.  We do not accept verbal cosign.  This test will not be scheduled until ATT is obtained.

## 2023-05-27 ENCOUNTER — Ambulatory Visit (HOSPITAL_COMMUNITY): Payer: Medicare Other | Attending: Cardiology

## 2023-05-27 DIAGNOSIS — R072 Precordial pain: Secondary | ICD-10-CM | POA: Diagnosis present

## 2023-05-27 LAB — MYOCARDIAL PERFUSION IMAGING
LV dias vol: 48 mL (ref 46–106)
LV sys vol: 18 mL
Nuc Stress EF: 63 %
Peak HR: 89 {beats}/min
Rest HR: 72 {beats}/min
Rest Nuclear Isotope Dose: 10.8 mCi
SDS: 1
SRS: 3
SSS: 4
ST Depression (mm): 0 mm
Stress Nuclear Isotope Dose: 31.8 mCi
TID: 0.9

## 2023-05-27 MED ORDER — REGADENOSON 0.4 MG/5ML IV SOLN
0.4000 mg | Freq: Once | INTRAVENOUS | Status: AC
  Administered 2023-05-27: 0.4 mg via INTRAVENOUS

## 2023-05-27 MED ORDER — TECHNETIUM TC 99M TETROFOSMIN IV KIT
31.8000 | PACK | Freq: Once | INTRAVENOUS | Status: AC | PRN
  Administered 2023-05-27: 31.8 via INTRAVENOUS

## 2023-05-27 MED ORDER — TECHNETIUM TC 99M TETROFOSMIN IV KIT
10.8000 | PACK | Freq: Once | INTRAVENOUS | Status: AC | PRN
  Administered 2023-05-27: 10.8 via INTRAVENOUS

## 2023-05-29 ENCOUNTER — Telehealth: Payer: Self-pay

## 2023-05-29 DIAGNOSIS — K769 Liver disease, unspecified: Secondary | ICD-10-CM

## 2023-05-29 NOTE — Telephone Encounter (Signed)
The patient has been notified of the result and verbalized understanding.  All questions (if any) were answered. Cindi Carbon [place], RN 05/29/2023 4:32 PM   Placed order for referral and ultrasound of liver.

## 2023-05-29 NOTE — Telephone Encounter (Signed)
-----   Message from Charlton Haws sent at 05/28/2023  8:55 PM EST ----- Radiololgy suggests f/u for lesion on liver Order hepatic US and refer to GI not clear that it is anything to worry about

## 2023-06-03 ENCOUNTER — Encounter (INDEPENDENT_AMBULATORY_CARE_PROVIDER_SITE_OTHER): Payer: Self-pay

## 2023-06-03 ENCOUNTER — Ambulatory Visit (HOSPITAL_BASED_OUTPATIENT_CLINIC_OR_DEPARTMENT_OTHER)
Admission: RE | Admit: 2023-06-03 | Discharge: 2023-06-03 | Disposition: A | Discharge status: Home / Self Care | Payer: Medicare Other | Source: Ambulatory Visit | Attending: Cardiovascular Disease | Admitting: Cardiovascular Disease

## 2023-06-03 DIAGNOSIS — K769 Liver disease, unspecified: Secondary | ICD-10-CM | POA: Diagnosis present

## 2023-06-04 ENCOUNTER — Encounter: Payer: Self-pay | Admitting: Gastroenterology

## 2023-08-13 NOTE — Progress Notes (Unsigned)
 HPI :  Seen by Dr. Orvan Falconer 05/2018  There is a history of severe endometriosis necessitating multiple surgeries. She has had 2 C-sections, a vaginal hysterectomy and resection of endometriomas in [place] approximately 20 years ago. She had a postoperative complication of perforated bladder which necessitated a suprapubic catheter and subsequent reoperation. She was told to have pelvic adhesions.   Her last colonoscopy 11/09/14 revealed a cecal tubular adenoma, mild diverticulosis throughout the entire examined colon and small internal hemorrhoids.  Surveillance colonoscopy recommended in 5 years.  EGD 03/31/13 revealed grade 2 esophagitis and antral gastritis with erosions and small superficial ulceration. Pathology revealed chronically inflamed squamous and cardiac type mucosa in the GE junction.  Biopsies were negative for H. Pylori.   Evaluation in 2016 included CT of the abdomen and pelvis with contrast 11/18/2014 showed no acute abnormalities.  There was a 9 mm hepatic cyst as well as some tiny cyst located in the right lobe and caudate of the liver.  Mild diverticulosis noted.   CT abdomen / pelvis 05/07/2022: MPRESSION: 1. The appendix is dilated up to 8 mm diameter (compared to 5 mm previously). There appears to be transmural hyperenhancement in the wall of the appendix and despite good opacification of distal small bowel and right colon, the dependent appendix is non-opacified with a diffusely fluid-filled lumen. Subtle periappendiceal edema is evident. Imaging features raise concern for acute appendicitis. No evidence for perforation or abscess. 2. Hepatic steatosis. 3. Stable 14 mm right adrenal nodule compatible with benign etiology such as lipid poor adenoma. No followup imaging is recommended. 4. Left colonic diverticulosis without diverticulitis. 5.  Aortic Atherosclerosis (ICD10-I70.0).    Cardiac CT 05/21/2023: IMPRESSION: 13 mm focus of parenchymal low attenuation  within the right lobe of the liver. Correlation with nonemergent hepatic ultrasound is recommended to further exclude the presence of an underlying hepatic Mass  IMPRESSION: 1. Coronary calcium score of 87.3. This was 58th percentile for age-, race-, and sex-matched controls.   RUQ [place] 06/03/23: FINDINGS: The liver demonstrates a increased echotexture without intrahepatic biliary dilatation. There are two simple cysts visualized within the left lobe, largest measuring 1.1 cm. A 1.6 cm avascular lesion with a hypoechoic lesion with an echogenic center is also visualized within the left hepatic lobe. There is normal hepatopetal flow visualized within the main portal vein.   The gallbladder is surgically absent. The common bile duct measures 0.4 cm.   IMPRESSION: 1. Increased hepatic echotexture, most commonly seen with steatosis. Correlation with LFT's is recommended.   2. Three left hepatic lobe lesions; two simple cysts and a 1.6 cm avascular hypoechoic lesion with an echogenic center.   3.  Surgically absent gallbladder.  No biliary dilatation.         Past Medical History:  Diagnosis Date   Anal fissure    Anemia    in the past with surgery but no transfusion   CELLULITIS/ABSCESS NOS 07/04/2009   Chronic sore throat    Diverticulosis    Endometriosis    Gallstones    Hx of gastric ulcer    Hypertension    OSTEOPOROSIS 07/03/2007   osteopenia   Other diseases of nasal cavity and sinuses(478.19) 06/02/2009   Snores      Past Surgical History:  Procedure Laterality Date   BLADDER REPAIR  1993   post perforation during surgery for endometriosis   BREAST BIOPSY  2376,2831   rt and lt-negative   CESAREAN SECTION     x 2  CHOLECYSTECTOMY  2000   COLONOSCOPY  09-28-2004   tics only; 2016   complete hysterectomy  1985   following partial hysterectomy   DILATION AND CURETTAGE OF UTERUS     LAPAROSCOPIC ENDOMETRIOSIS FULGURATION  1993   with subsequent bladder  perforation   ORIF ANKLE FRACTURE Left 10/03/2019   Procedure: OPEN REDUCTION INTERNAL FIXATION (ORIF) ANKLE FRACTURE;  Surgeon: Nadara Mustard, MD;  Location: [facility] OR;  Service: Orthopedics;  Laterality: Left;   PARTIAL HYSTERECTOMY     ROTATOR CUFF REPAIR Right    SHOULDER ARTHROSCOPY  2010   right   TONSILLECTOMY AND ADENOIDECTOMY Bilateral 08/09/2013   Procedure: BILATERAL TONSILLECTOMY ;  Surgeon: Darletta Moll, MD;  Location: [facility];  Service: ENT;  Laterality: Bilateral;   Family History  Problem Relation Age of Onset   Pancreatic cancer Mother    Kidney cancer Mother    Heart disease Father    Lung cancer Father        smoker   Prostate cancer Other    Breast cancer Maternal Aunt        x 2   Colon cancer Neg Hx    Rectal cancer Neg Hx    Stomach cancer Neg Hx    Colon polyps Neg Hx    Esophageal cancer Neg Hx    Social History   Tobacco Use   Smoking status: Never   Smokeless tobacco: Never  Vaping Use   Vaping status: Never Used  Substance Use Topics   Alcohol use: Yes    Alcohol/week: 0.0 standard drinks of alcohol    Comment: occasionaly   Drug use: No   Current Outpatient Medications  Medication Sig Dispense Refill   atorvastatin (LIPITOR) 10 MG tablet Take 1 tablet (10 mg total) by mouth daily. 90 tablet 3   cholecalciferol (VITAMIN D3) 25 MCG (1000 UNIT) tablet Take 1,000 Units by mouth daily.     Multiple Vitamins-Minerals (MULTIVITAMIN WITH MINERALS) tablet Take 1 tablet by mouth daily.     triamterene-hydrochlorothiazide (DYAZIDE) 37.5-25 MG per capsule Take 1 capsule by mouth every morning.     No current facility-administered medications for this visit.   Allergies  Allergen Reactions   Latex Other (See Comments)    Blisters      Review of Systems: All systems reviewed and negative except where noted in HPI.    No results found.  Physical Exam: There were no vitals taken for this visit. Constitutional:  Pleasant,well-developed, ***female in no acute distress. HEENT: Normocephalic and atraumatic. Conjunctivae are normal. No scleral icterus. Neck supple.  Cardiovascular: Normal rate, regular rhythm.  Pulmonary/chest: Effort normal and breath sounds normal. No wheezing, rales or rhonchi. Abdominal: Soft, nondistended, nontender. Bowel sounds active throughout. There are no masses palpable. No hepatomegaly. Extremities: no edema Lymphadenopathy: No cervical adenopathy noted. Neurological: Alert and oriented to person place and time. Skin: Skin is warm and dry. No rashes noted. Psychiatric: Normal mood and affect. Behavior is normal.   ASSESSMENT: 78 y.o. female here for assessment of the following  No diagnosis found.  PLAN:   Wendall Stade, MD

## 2023-08-14 ENCOUNTER — Encounter: Payer: Self-pay | Admitting: Gastroenterology

## 2023-08-14 ENCOUNTER — Other Ambulatory Visit (INDEPENDENT_AMBULATORY_CARE_PROVIDER_SITE_OTHER)

## 2023-08-14 ENCOUNTER — Ambulatory Visit (INDEPENDENT_AMBULATORY_CARE_PROVIDER_SITE_OTHER): Payer: Medicare Other | Admitting: Gastroenterology

## 2023-08-14 ENCOUNTER — Encounter (INDEPENDENT_AMBULATORY_CARE_PROVIDER_SITE_OTHER): Payer: Self-pay

## 2023-08-14 VITALS — BP 122/80 | HR 71 | Ht 60.0 in | Wt 186.0 lb

## 2023-08-14 DIAGNOSIS — R7303 Prediabetes: Secondary | ICD-10-CM

## 2023-08-14 DIAGNOSIS — E66812 Obesity, class 2: Secondary | ICD-10-CM

## 2023-08-14 DIAGNOSIS — K760 Fatty (change of) liver, not elsewhere classified: Secondary | ICD-10-CM

## 2023-08-14 DIAGNOSIS — Z6836 Body mass index (BMI) 36.0-36.9, adult: Secondary | ICD-10-CM | POA: Diagnosis not present

## 2023-08-14 DIAGNOSIS — K769 Liver disease, unspecified: Secondary | ICD-10-CM

## 2023-08-14 DIAGNOSIS — R7301 Impaired fasting glucose: Secondary | ICD-10-CM

## 2023-08-14 DIAGNOSIS — Z860100 Personal history of colon polyps, unspecified: Secondary | ICD-10-CM

## 2023-08-14 LAB — CBC WITH DIFFERENTIAL/PLATELET
Basophils Absolute: 0.1 10*3/uL (ref 0.0–0.1)
Basophils Relative: 1.4 % (ref 0.0–3.0)
Eosinophils Absolute: 0.2 10*3/uL (ref 0.0–0.7)
Eosinophils Relative: 3.2 % (ref 0.0–5.0)
HCT: 42.5 % (ref 36.0–46.0)
Hemoglobin: 14.5 g/dL (ref 12.0–15.0)
Lymphocytes Relative: 29 % (ref 12.0–46.0)
Lymphs Abs: 1.9 10*3/uL (ref 0.7–4.0)
MCHC: 34.1 g/dL (ref 30.0–36.0)
MCV: 91.3 fl (ref 78.0–100.0)
Monocytes Absolute: 0.6 10*3/uL (ref 0.1–1.0)
Monocytes Relative: 8.6 % (ref 3.0–12.0)
Neutro Abs: 3.9 10*3/uL (ref 1.4–7.7)
Neutrophils Relative %: 57.8 % (ref 43.0–77.0)
Platelets: 286 10*3/uL (ref 150.0–400.0)
RBC: 4.65 Mil/uL (ref 3.87–5.11)
RDW: 13.1 % (ref 11.5–15.5)
WBC: 6.7 10*3/uL (ref 4.0–10.5)

## 2023-08-14 LAB — HEPATIC FUNCTION PANEL
ALT: 27 U/L (ref 0–35)
AST: 21 U/L (ref 0–37)
Albumin: 4.6 g/dL (ref 3.5–5.2)
Alkaline Phosphatase: 73 U/L (ref 39–117)
Bilirubin, Direct: 0.1 mg/dL (ref 0.0–0.3)
Total Bilirubin: 0.6 mg/dL (ref 0.2–1.2)
Total Protein: 7.3 g/dL (ref 6.0–8.3)

## 2023-08-14 MED ORDER — NA SULFATE-K SULFATE-MG SULF 17.5-3.13-1.6 GM/177ML PO SOLN: 1.0000 | Freq: Once | ORAL | 0 refills | Status: AC

## 2023-08-14 NOTE — Patient Instructions (Addendum)
 Please go to the lab in the basement of our building to have lab work done as you leave today. Hit "B" for basement when you get on the elevator.  When the doors open the lab is on your left.  We will call you with the results. Thank you.  You have been scheduled for an abdominal ultrasound at [facility] (1st floor of hospital) on Wed, 7-2  at 9:00 am. Please arrive 30 minutes prior to your appointment for registration. Make certain not to have anything to eat or drink 6 hours prior to your appointment. Should you need to reschedule your appointment, please contact radiology at [phone]. This test typically takes about 30 minutes to perform.   You have been scheduled for a Colonoscopy. Please follow written instructions given to you at your visit today.  If you use inhalers (even only as needed), please bring them with you on the day of your procedure. If you need to reschedule or cancel this appointment please call at least a week in advance.  DO NOT TAKE 7 DAYS PRIOR TO TEST-  Trulicity (dulaglutide) Ozempic, Wegovy (semaglutide) Mounjaro (tirzepatide) Bydureon Bcise (exanatide extended release)  DO NOT TAKE 1 DAY PRIOR TO YOUR TEST  Rybelsus (semaglutide) Adlyxin (lixisenatide) Victoza (liraglutide) Byetta (exanatide) ___________________________________________________________________________  We are referring you to [facility].  They will contact you directly to schedule an appointment.  It may take a week or more before you hear from them.  Please feel free to contact us if you have not heard from them within 2 weeks and we will follow up on the referral.   Thank you for entrusting me with your care and for choosing [facility], Dr. Ileene Patrick   If your blood pressure at your visit was 140/90 or greater, please contact your primary care physician to follow up on this. ______________________________________________________  If you are age  59 or older, your body mass index should be between 23-30. Your Body mass index is 36.33 kg/m. If this is out of the aforementioned range listed, please consider follow up with your Primary Care Provider.  If you are age 29 or younger, your body mass index should be between 19-25. Your Body mass index is 36.33 kg/m. If this is out of the aformentioned range listed, please consider follow up with your Primary Care Provider.  ________________________________________________________  The [place] [facility] providers would like to encourage you to use [facility] to communicate with providers for non-urgent requests or questions.  Due to long hold times on the telephone, sending your provider a message by [facility] may be a faster and more efficient way to get a response.  Please allow 48 business hours for a response.  Please remember that this is for non-urgent requests.  _______________________________________________________  Due to recent changes in healthcare laws, you may see the results of your imaging and laboratory studies on MyChart before your provider has had a chance to review them.  We understand that in some cases there may be results that are confusing or concerning to you. Not all laboratory results come back in the same time frame and the provider may be waiting for multiple results in order to interpret others.  Please give [place] 48 hours in order for your provider to thoroughly review all the results before contacting the office for clarification of your results.

## 2023-08-15 LAB — HEMOGLOBIN A1C
Hgb A1c MFr Bld: 5.9 %{Hb} — ABNORMAL HIGH (ref <5.7)
Mean Plasma Glucose: 123 mg/dL
eAG (mmol/L): 6.8 mmol/L

## 2023-08-20 DIAGNOSIS — Z0289 Encounter for other administrative examinations: Secondary | ICD-10-CM

## 2023-08-21 ENCOUNTER — Encounter: Payer: Self-pay | Admitting: Bariatrics

## 2023-08-21 ENCOUNTER — Ambulatory Visit: Admitting: Bariatrics

## 2023-08-21 VITALS — BP 119/68 | HR 80 | Temp 97.8°F | Ht 60.0 in | Wt 182.0 lb

## 2023-08-21 DIAGNOSIS — R7303 Prediabetes: Secondary | ICD-10-CM | POA: Diagnosis not present

## 2023-08-21 DIAGNOSIS — E669 Obesity, unspecified: Secondary | ICD-10-CM | POA: Diagnosis not present

## 2023-08-21 DIAGNOSIS — Z6835 Body mass index (BMI) 35.0-35.9, adult: Secondary | ICD-10-CM

## 2023-08-21 DIAGNOSIS — E66812 Obesity, class 2: Secondary | ICD-10-CM

## 2023-08-21 NOTE — Progress Notes (Signed)
 Office: [phone]  /  Fax: [phone]   Initial Visit  Jamie Jensen was seen in clinic today to evaluate for obesity. She is interested in losing weight to improve overall health and reduce the risk of weight related complications. She presents today to review program treatment options, initial physical assessment, and evaluation.     She was referred by: Specialist  When asked what else they would like to accomplish? She states: Adopt healthier eating patterns, Improve energy levels and physical activity, Improve existing medical conditions, and Improve quality of life  When asked how has your weight affected you? She states: Having fatigue  Some associated conditions: Other: fatty liver  Contributing factors: Family history of obesity, Slow metabolism for age, and Frequent travel  Weight promoting medications identified: None  Current nutrition plan: Other: Weight Watchers  Current level of physical activity: walking  Current or previous pharmacotherapy: None  Response to medication: Never tried medications   Past medical history includes:   Past Medical History:  Diagnosis Date   Anal fissure    Anemia    in the past with surgery but no transfusion   CELLULITIS/ABSCESS NOS 07/04/2009   Chronic sore throat    Diverticulosis    Endometriosis    Gallstones    Hx of gastric ulcer    Hypertension    OSTEOPOROSIS 07/03/2007   osteopenia   Other diseases of nasal cavity and sinuses(478.19) 06/02/2009   Snores      Objective:   BP 119/68   Pulse 80   Temp 97.8 F (36.6 C)   Ht 5' (1.524 m)   Wt 182 lb (82.6 kg)   SpO2 96%   BMI 35.54 kg/m  She was weighed on the bioimpedance scale: Body mass index is 35.54 kg/m.  Peak Weight:182 lbs , Body Fat%:40.1 %, Visceral Fat Rating:14, Weight trend over the last 12 months: Unchanged  General:  Alert, oriented and cooperative. Patient is in no acute distress.  Respiratory: Normal respiratory effort, no problems  with respiration noted  Extremities: Normal range of motion.    Mental Status: Normal mood and affect. Normal behavior. Normal judgment and thought content.   DIAGNOSTIC DATA REVIEWED:  BMET    Component Value Date/Time   NA 140 10/03/2019 1021   K 3.9 10/03/2019 1021   CL 106 10/03/2019 1021   CO2 24 10/03/2019 1021   GLUCOSE 106 (H) 10/03/2019 1021   BUN 13 10/03/2019 1021   CREATININE 0.94 10/03/2019 1021   CALCIUM 8.6 (L) 10/03/2019 1021   GFRNONAA >60 10/03/2019 1021   GFRAA >60 10/03/2019 1021   Lab Results  Component Value Date   HGBA1C 5.9 (H) 08/14/2023   No results found for: "INSULIN" CBC    Component Value Date/Time   WBC 6.7 08/14/2023 0911   RBC 4.65 08/14/2023 0911   HGB 14.5 08/14/2023 0911   HCT 42.5 08/14/2023 0911   PLT 286.0 08/14/2023 0911   MCV 91.3 08/14/2023 0911   MCH 30.4 10/03/2019 1021   MCHC 34.1 08/14/2023 0911   RDW 13.1 08/14/2023 0911   Iron/TIBC/Ferritin/ %Sat No results found for: "IRON", "TIBC", "FERRITIN", "IRONPCTSAT" Lipid Panel  No results found for: "CHOL", "TRIG", "HDL", "CHOLHDL", "VLDL", "LDLCALC", "LDLDIRECT" Hepatic Function Panel     Component Value Date/Time   PROT 7.3 08/14/2023 0911   ALBUMIN 4.6 08/14/2023 0911   AST 21 08/14/2023 0911   ALT 27 08/14/2023 0911   ALKPHOS 73 08/14/2023 0911   BILITOT 0.6 08/14/2023 0911  BILIDIR 0.1 08/14/2023 0911   No results found for: "TSH"   Assessment and Plan:   Prediabetes Last A1c was 5.9  Medication(s): none Lab Results  Component Value Date   HGBA1C 5.9 (H) 08/14/2023   No results found for: "INSULIN"  Plan: Discussed prediabetes.  Will minimize all refined carbohydrates both sweets and starches.  Will begin  the plan and exercise.  Consider exercise options Will keep protein, water, and fiber intake high.    Generalized Obesity: Current BMI 35.54    Obesity Treatment / Action Plan:  Patient will work on garnering support from family and friends  to begin weight loss journey. Will work on eliminating or reducing the presence of highly palatable, calorie dense foods in the home. Will complete provided nutritional and psychosocial assessment questionnaire before the next appointment. Will be scheduled for indirect calorimetry to determine resting energy expenditure in a fasting state.  This will allow us  to create a reduced calorie, high-protein meal plan to promote loss of fat mass while preserving muscle mass. Counseled on the health benefits of losing 5%-15% of total body weight. Was counseled on nutritional approaches to weight loss and benefits of reducing processed foods and consuming plant-based foods and high quality protein as part of nutritional weight management. Was counseled on pharmacotherapy and role as an adjunct in weight management.   Obesity Education Performed Today:  She was weighed on the bioimpedance scale and results were discussed and documented in the synopsis.  We discussed obesity as a disease and the importance of a more detailed evaluation of all the factors contributing to the disease.  We discussed the importance of long term lifestyle changes which include nutrition, exercise and behavioral modifications as well as the importance of customizing this to her specific health and social needs.  We discussed the benefits of reaching a healthier weight to alleviate the symptoms of existing conditions and reduce the risks of the biomechanical, metabolic and psychological effects of obesity.  Discussed New Patient/Late Arrival, and Cancellation Policies. Patient voiced understanding and allowed to ask questions.   Jamie Jensen appears to be in the action stage of change and states they are ready to start intensive lifestyle modifications and behavioral modifications.  30 minutes was spent today on this visit including the above counseling, pre-visit chart review, and post-visit documentation.  Reviewed by  clinician on day of visit: allergies, medications, problem list, medical history, surgical history, family history, social history, and previous encounter notes.    Tait Balistreri A. Claiborne CrewO.

## 2023-09-04 ENCOUNTER — Ambulatory Visit: Admitting: Bariatrics

## 2023-09-09 ENCOUNTER — Encounter: Payer: Self-pay | Admitting: Bariatrics

## 2023-09-09 ENCOUNTER — Ambulatory Visit (INDEPENDENT_AMBULATORY_CARE_PROVIDER_SITE_OTHER): Admitting: Bariatrics

## 2023-09-09 VITALS — BP 124/76 | HR 62 | Temp 97.7°F | Ht 60.0 in | Wt 178.0 lb

## 2023-09-09 DIAGNOSIS — E669 Obesity, unspecified: Secondary | ICD-10-CM

## 2023-09-09 DIAGNOSIS — R5383 Other fatigue: Secondary | ICD-10-CM

## 2023-09-09 DIAGNOSIS — R0602 Shortness of breath: Secondary | ICD-10-CM | POA: Diagnosis not present

## 2023-09-09 DIAGNOSIS — Z0000 Encounter for general adult medical examination without abnormal findings: Secondary | ICD-10-CM

## 2023-09-09 DIAGNOSIS — E559 Vitamin D deficiency, unspecified: Secondary | ICD-10-CM | POA: Diagnosis not present

## 2023-09-09 DIAGNOSIS — K760 Fatty (change of) liver, not elsewhere classified: Secondary | ICD-10-CM

## 2023-09-09 DIAGNOSIS — Z1331 Encounter for screening for depression: Secondary | ICD-10-CM | POA: Diagnosis not present

## 2023-09-09 DIAGNOSIS — Z6834 Body mass index (BMI) 34.0-34.9, adult: Secondary | ICD-10-CM

## 2023-09-09 DIAGNOSIS — R7303 Prediabetes: Secondary | ICD-10-CM | POA: Diagnosis not present

## 2023-09-09 NOTE — Progress Notes (Signed)
 At a Glance:  Vitals Temp: 97.7 F (36.5 C) BP: 124/76 Pulse Rate: 62 SpO2: 96 %   Anthropometric Measurements Height: 5' (1.524 m) Weight: 178 lb (80.7 kg) BMI (Calculated): 34.76 Starting Weight: 178lb Peak Weight: 182lb Waist Measurement : 46 inches   Body Composition  Body Fat %: 40.8 % Fat Mass (lbs): 73 lbs Muscle Mass (lbs): 100.4 lbs Total Body Water (lbs): 68.2 lbs Visceral Fat Rating : 13   Other Clinical Data Fasting: yes Labs: yes Today's Visit #: 1 Starting Date: 09/09/23    Indirect Calorimeter:   Resting Metabolic Rate ( RMR):  RMR (actual): 1598 kcal RMR (calculated): 1449 kcal The calculated basal metabolic rate is 1610 kcal  thus her basal metabolic rate is better than expected.  Plan:   Indirect calorimeter completed, interpreted and reviewed with patient today and allowed to ask questions.  Discussed the implications for the chosen plan and exercise based on the RMR reading.  Will consider repeating the RMR in the future based on weight loss.    Chief Complaint:  Obesity   Subjective:  Jamie Jensen (MR# [id number]) is a 78 y.o. female who presents for evaluation and treatment of obesity and related comorbidities.   Jamie Jensen is currently in the action stage of change and ready to dedicate time achieving and maintaining a healthier weight. Jamie Jensen is interested in becoming our patient and working on intensive lifestyle modifications including (but not limited to) diet and exercise for weight loss.  Jamie Jensen has been struggling with her weight. She has been unsuccessful in either losing weight, maintaining weight loss, or reaching her healthy weight goal.  Jamie Jensen's habits were reviewed today and are as follows: Her family eats meals together, she started gaining weight about 10 years ago, she snacks frequently in the evenings, and she skips meals frequently.  Current or previous pharmacotherapy: None  Response to medication: Never  tried medications  Other Fatigue Jamie Jensen denies daytime somnolence and denies waking up still tired. Jamie Jensen generally gets 8 hours of sleep per night, and states that she has generally restful sleep. Snoring is not present. Apneic episodes is not present. Epworth Sleepiness Score is 5.   Shortness of Breath Jamie Jensen notes increasing shortness of breath with exercising and seems to be worsening over time with weight gain. She notes getting out of breath sooner with activity than she used to. This has not gotten worse recently. Jamie Jensen denies shortness of breath at rest or orthopnea.  Depression Screen Jamie Jensen's Food and Mood (modified PHQ-9) score was 4. <5 no depression      No data to display           Assessment and Plan:   Other Fatigue Jamie Jensen does feel that her weight is causing her energy to be lower than it should be. Fatigue may be related to obesity, depression or many other causes. Labs will be ordered, and in the meanwhile, Jamie Jensen will focus on self care including making healthy food choices, increasing physical activity and focusing on stress reduction.  Shortness of Breath Jamie Jensen does feel that she gets out of breath more easily that she used to when she exercises. Jamie Jensen's shortness of breath appears to be obesity related and exercise induced. She has agreed to work on weight loss and gradually increase exercise to treat her exercise induced shortness of breath. Will continue to monitor closely.  Health Maintenance:   Obesity   Plan: Will do EKG, indirect calorimetry, and labs.     Vitamin  D Deficiency  She is at risk for vitamin D deficiency due to obesity.  She is not on any supplements.  No results found for: "VD25OH"  Plan: Will check for vitamin D deficiency.    Prediabetes Last A1c was 5.9  Medication(s): none Lab Results  Component Value Date   HGBA1C 5.9 (H) 08/14/2023   No results found for: "INSULIN"  Plan: Will minimize all refined  carbohydrates both sweets and starches.  Will work on the plan and exercise.  Consider both aerobic and resistance training.  Will keep protein, water, and fiber intake high.  Increase Polyunsaturated and Monounsaturated fats to increase satiety and encourage weight loss.   Hepatic steatosis:   He/She was diagnosed with fatty liver disease by a scan on 05/07/2022 and an US  of the abdomen on 06/03/2023 Liver enzymes are normal/abnormal.  Fibrosis 4 Score = 1.06  Fib-4 interpretation is not validated for people under 35 or over 27 years of age. However, scores under 2.0 are generally considered low risk.   Plan:  Will follow the plan and exercise. Exercise both as cardio and resistance have been proven to help decrease fatty liver.  Will minimize all carbohydrates both sweets and starches.    Previous labs reviewed today. Date: 08/14/2023 HgbA1c and glucose, hepatic panel, CBC  Labs done today Lipids, Insulin, Vit D, and Thyroid  Panel   Generalized Obesity: BMI (Calculated): 34.76   Jamie Jensen is currently in the action stage of change and her goal is to begin weight loss efforts. I recommend Jamie Jensen begin the structured treatment plan as follows:  She has agreed to Category 2 Plan  Exercise goals: Older adults should determine their level of effort for physical activity relative to their level of fitness.  Behavioral modification strategies:increasing lean protein intake, increasing vegetables, increase H2O intake, increase high fiber foods, keeping healthy foods in the home, and planning for success  She was informed of the importance of frequent follow-up visits to maximize her success with intensive lifestyle modifications for her multiple health conditions. She was informed we would discuss her lab results at her next visit unless there is a critical issue that needs to be addressed sooner. Jamie Jensen agreed to keep her next visit at the agreed upon time to discuss these  results.  Objective:  General: Cooperative, alert, well developed, in no acute distress. HEENT: Conjunctivae and lids unremarkable. Cardiovascular: Regular rhythm.  Lungs: Normal work of breathing. Neurologic: No focal deficits.   Lab Results  Component Value Date   CREATININE 0.94 10/03/2019   BUN 13 10/03/2019   NA 140 10/03/2019   K 3.9 10/03/2019   CL 106 10/03/2019   CO2 24 10/03/2019   Lab Results  Component Value Date   ALT 27 08/14/2023   AST 21 08/14/2023   ALKPHOS 73 08/14/2023   BILITOT 0.6 08/14/2023   Lab Results  Component Value Date   HGBA1C 5.9 (H) 08/14/2023   No results found for: "INSULIN" No results found for: "TSH" No results found for: "CHOL", "HDL", "LDLCALC", "LDLDIRECT", "TRIG", "CHOLHDL" Lab Results  Component Value Date   WBC 6.7 08/14/2023   HGB 14.5 08/14/2023   HCT 42.5 08/14/2023   MCV 91.3 08/14/2023   PLT 286.0 08/14/2023   No results found for: "IRON", "TIBC", "FERRITIN"  Attestation Statements:  Applicable history such as the following:  allergies, medications, problem list, medical history, surgical history, family history, social history, and previous encounter notes reviewed by clinician on day of visit:  Time spent  on visit in care of the patient today including the items listed below was 47 minutes.    20 minutes were spent talking about the history,  15  minutes for face to face counseling implementing the plan, discussing the specifics of how to arrange meals, meal planning, water intake.   I spent face to face time discussing his/her plan, including breakfast, additional breakfast options, lunch, and dinner options, grocery list, and snacks.  I reviewed her indirect calorimetry. I discussed the implications for the diet plan.    Discussed the bio-impedence test (fat %, muscle mass, and water weight) and allowed the patient to ask questions.   Discussed the following information sheets: Category 2, Grocery List, 100  Calorie Snacks, 200 Calorie Snacks, and Microwave Meals.   I reviewed the labs which were ordered from his/her visit on 08/14/23  I additionally spent time documenting, reviewing, and checking the codes before submitting.   This may have been prepared with the assistance of Engineer, civil (consulting).  Occasional wrong-word or sound-a-like substitutions may have occurred due to the inherent limitations of voice recognition software.    Kirk Peper, DO

## 2023-09-10 LAB — VITAMIN D 25 HYDROXY (VIT D DEFICIENCY, FRACTURES): Vit D, 25-Hydroxy: 30.9 ng/mL (ref 30.0–100.0)

## 2023-09-10 LAB — LIPID PANEL WITH LDL/HDL RATIO
Cholesterol, Total: 117 mg/dL (ref 100–199)
HDL: 54 mg/dL (ref >39)
LDL Chol Calc (NIH): 42 mg/dL (ref 0–99)
LDL/HDL Ratio: 0.8 ratio (ref 0.0–3.2)
Triglycerides: 121 mg/dL (ref 0–149)
VLDL Cholesterol Cal: 21 mg/dL (ref 5–40)

## 2023-09-10 LAB — TSH+T4F+T3FREE
Free T4: 1.06 ng/dL (ref 0.82–1.77)
T3, Free: 3.4 pg/mL (ref 2.0–4.4)
TSH: 1.56 u[IU]/mL (ref 0.450–4.500)

## 2023-09-10 LAB — INSULIN, RANDOM: INSULIN: 23 u[IU]/mL (ref 2.6–24.9)

## 2023-09-18 ENCOUNTER — Ambulatory Visit (INDEPENDENT_AMBULATORY_CARE_PROVIDER_SITE_OTHER): Admitting: Bariatrics

## 2023-09-23 ENCOUNTER — Ambulatory Visit (INDEPENDENT_AMBULATORY_CARE_PROVIDER_SITE_OTHER): Admitting: Bariatrics

## 2023-09-23 ENCOUNTER — Encounter: Payer: Self-pay | Admitting: Bariatrics

## 2023-09-23 VITALS — BP 126/82 | HR 69 | Temp 98.0°F | Ht 60.0 in | Wt 175.0 lb

## 2023-09-23 DIAGNOSIS — E559 Vitamin D deficiency, unspecified: Secondary | ICD-10-CM | POA: Diagnosis not present

## 2023-09-23 DIAGNOSIS — Z6834 Body mass index (BMI) 34.0-34.9, adult: Secondary | ICD-10-CM

## 2023-09-23 DIAGNOSIS — R7303 Prediabetes: Secondary | ICD-10-CM

## 2023-09-23 DIAGNOSIS — E669 Obesity, unspecified: Secondary | ICD-10-CM

## 2023-09-23 DIAGNOSIS — E6609 Other obesity due to excess calories: Secondary | ICD-10-CM

## 2023-09-23 NOTE — Progress Notes (Signed)
 First follow-up after initial visit.        WEIGHT SUMMARY AND BIOMETRICS  Weight Lost Since Last Visit: 3lb  Weight Gained Since Last Visit: 0   Vitals Temp: 98 F (36.7 C) BP: 126/82 Pulse Rate: 69 SpO2: 95 %   Anthropometric Measurements Height: 5' (1.524 m) Weight: 175 lb (79.4 kg) BMI (Calculated): 34.18 Weight at Last Visit: 178lb Weight Lost Since Last Visit: 3lb Weight Gained Since Last Visit: 0 Starting Weight: 178lb Total Weight Loss (lbs): 3 lb (1.361 kg) Peak Weight: 182lb   Body Composition  Body Fat %: 34.6 % Fat Mass (lbs): 60.8 lbs Muscle Mass (lbs): 109.2 lbs Total Body Water (lbs): 67 lbs Visceral Fat Rating : 12   Other Clinical Data Fasting: no Labs: no Today's Visit #: 2 Starting Date: 09/09/23    OBESITY Jamie Jensen is here to discuss her progress with her obesity treatment plan along with follow-up of her obesity related diagnoses.    Nutrition Plan: the Category 2 plan - 100% adherence.  Current exercise: walking and yard work  Interim History:  She is down 3 lbs since her last visit.  Eating all of the food on the plan., Protein intake is as prescribed, Is not skipping meals, and Water intake is adequate.  Initial positives regarding the dietary plan: some good choices.  Initial challenges regarding  the dietary plan: some challenges with going out to eat.   Hunger is moderately controlled.  Cravings are moderately controlled.  Assessment/Plan:   Prediabetes Last A1c was 5.9  Medication(s): none Lab Results  Component Value Date   HGBA1C 5.9 (H) 08/14/2023   Lab Results  Component Value Date   INSULIN  23.0 09/09/2023    Plan: Will minimize all refined carbohydrates both sweets and starches.  Will work on the plan and exercise.  Consider both aerobic and resistance training.  Will keep protein,  water, and fiber intake high.  Increase Polyunsaturated and Monounsaturated fats to increase satiety and encourage weight loss.  Aim for 7 to 9 hours of sleep nightly.  Information about prediabetes, and handout given.  Seasoning sheet given.  Eating out and travel sheet given.   Vitamin D  Insuffiency:  Vitamin D  is not at goal of 50.  Most recent vitamin D  level was 30.9. She is on  prescription ergocalciferol  50,000 IU weekly. Lab Results  Component Value Date   VD25OH 30.9 09/09/2023    Plan: Continue prescription vitamin D  50,000 IU weekly.  Will write a Rx for patient to continue her vitamin D , high dose, for 3 months in the near future.    Generalized Obesity: Current BMI BMI (Calculated): 34.18   Jamie Jensen is currently in the action stage of change. As such, her goal is to continue with weight loss efforts.  She has agreed to the Category 2 plan.  Exercise goals: Older adults should follow the adult guidelines. When older adults cannot meet the adult guidelines, they should be as physically active as their abilities and conditions will allow.   Behavioral modification strategies: no meal skipping, increase water intake, better snacking choices, planning for success, increasing fiber rich foods, decreasing sodium intake, keep healthy foods in the home, travel eating strategies, and mindful eating.  Jamie Jensen has agreed to follow-up with our clinic in 2 weeks.    Labs reviewed today from last visit (, Lipids,  insulin , vitamin D , and thyroid  panel).   Objective:   VITALS: Per patient if applicable, see vitals. GENERAL: Alert and in no  acute distress. CARDIOPULMONARY: No increased WOB. Speaking in clear sentences.  PSYCH: Pleasant and cooperative. Speech normal rate and rhythm. Affect is appropriate. Insight and judgement are appropriate. Attention is focused, linear, and appropriate.  NEURO: Oriented as arrived to appointment on time with no prompting.   Attestation  Statements:   This was prepared with the assistance of Engineer, civil (consulting).  Occasional wrong-word or sound-a-like substitutions may have occurred due to the inherent limitations of voice recognition software.   Kirk Peper, DO

## 2023-09-25 ENCOUNTER — Other Ambulatory Visit (INDEPENDENT_AMBULATORY_CARE_PROVIDER_SITE_OTHER): Payer: Self-pay

## 2023-09-25 ENCOUNTER — Ambulatory Visit (INDEPENDENT_AMBULATORY_CARE_PROVIDER_SITE_OTHER): Admitting: Orthopedic Surgery

## 2023-09-25 ENCOUNTER — Encounter: Payer: Self-pay | Admitting: Orthopedic Surgery

## 2023-09-25 DIAGNOSIS — M5442 Lumbago with sciatica, left side: Secondary | ICD-10-CM

## 2023-09-25 DIAGNOSIS — G8929 Other chronic pain: Secondary | ICD-10-CM

## 2023-09-25 DIAGNOSIS — M25572 Pain in left ankle and joints of left foot: Secondary | ICD-10-CM

## 2023-09-25 DIAGNOSIS — R1032 Left lower quadrant pain: Secondary | ICD-10-CM | POA: Diagnosis not present

## 2023-09-25 NOTE — Progress Notes (Signed)
 Office Visit Note   Patient: Jamie Jensen           Date of Birth: 1945-06-02           MRN: [id number] Visit Date: 09/25/2023              Requested by: Lorenso Romance, MD [address] [place],  [place] [address] PCP: Lorenso Romance, MD  Chief Complaint  Patient presents with   Left Foot - Pain   Lower Back - Pain   Left Hip - Pain      HPI: Patient is a 78 year old woman who is status post open reduction internal fixation left ankle fracture in 2021.  Patient states she has some clicking in her ankle.  Patient states she has some lateral left hip pain which radiates from the buttocks distally.  Assessment & Plan: Visit Diagnoses:  1. Pain in left ankle and joints of left foot   2. Left groin pain   3. 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  Discussed she does have radicular symptoms from her lumbar spine and patient states she will continue with Advil as needed.  She does not want to consider prednisone.  Follow-Up Instructions: No follow-ups on file.   Ortho Exam  Patient is alert, oriented, no adenopathy, well-dressed, normal affect, normal respiratory effort. Examination patient has good range of motion of the left ankle.  She has a negative straight leg raise of the left.  With her knee extended she has equinus contracture about 20 degrees on the left.  There is full range of motion of the left hip without pain with passive range of motion.  Radiographs does show some calcification of the aorta in the lumbar radiographs patient has no focal motor weakness in either lower extremity.  Imaging: XR HIP UNILAT W OR W/O PELVIS 2-3 VIEWS LEFT Result Date: 09/25/2023 2 view radiographs of the left hip shows a congruent joint space symmetric with the right hip.  XR Ankle Complete Left Result Date: 09/25/2023 2 view  radiographs of the left ankle shows early arthritic changes of the ankle with joint space narrowing and subchondral cysts.  There is lucency around the retained hardware without hardware failure from the internal fixation Weber B fracture with syndesmotic injury.  XR Lumbar Spine 2-3 Views Result Date: 09/25/2023 2 view radiographs of the lumbar spine shows degenerative changes with a mild scoliosis with narrowing to the left with bony spurs.  No images are attached to the encounter.  Labs: Lab Results  Component Value Date   HGBA1C 5.9 (H) 08/14/2023     Lab Results  Component Value Date   ALBUMIN 4.6 08/14/2023    No results found for: "MG" Lab Results  Component Value Date   VD25OH 30.9 09/09/2023    No results found for: "PREALBUMIN"    Latest Ref Rng & Units 08/14/2023    9:11 AM 10/03/2019   10:21 AM 10/02/2019    1:36 PM  CBC EXTENDED  WBC 4.0 - 10.5 K/uL 6.7  12.2  10.7   RBC 3.87 - 5.11 Mil/uL 4.65  4.27  4.53   Hemoglobin 12.0 - 15.0 g/dL 78.2  95.6  21.3   HCT 36.0 - 46.0 % 42.5  40.0  41.5   Platelets 150.0 - 400.0 K/uL 286.0  265  270   NEUT#  1.4 - 7.7 K/uL 3.9   9.2   Lymph# 0.7 - 4.0 K/uL 1.9   0.9      There is no height or weight on file to calculate BMI.  Orders:  Orders Placed This Encounter  Procedures   XR Ankle Complete Left   XR HIP UNILAT W OR W/O PELVIS 2-3 VIEWS LEFT   XR Lumbar Spine 2-3 Views   No orders of the defined types were placed in this encounter.    Procedures: No procedures performed  Clinical Data: No additional findings.  ROS:  All other systems negative, except as noted in the HPI. Review of Systems  Objective: Vital Signs: There were no vitals taken for this visit.  Specialty Comments:  No specialty comments available.  PMFS History: Patient Active Problem List   Diagnosis Date Noted   Closed left ankle fracture 10/02/2019   Ankle dislocation, left, initial encounter    Syndesmotic disruption of ankle,  left, initial encounter    Closed bimalleolar fracture of left ankle    Syncope 08/07/2012   Goiter 08/07/2012   CELLULITIS/ABSCESS NOS 07/04/2009   OTHER DISEASES OF NASAL CAVITY AND SINUSES 06/02/2009   Osteoporosis 07/03/2007   Past Medical History:  Diagnosis Date   Anal fissure    Anemia    in the past with surgery but no transfusion   CELLULITIS/ABSCESS NOS 07/04/2009   Chronic sore throat    Diverticulosis    Endometriosis    Fatty liver    Gallstones    Hx of gastric ulcer    Hypertension    OSTEOPOROSIS 07/03/2007   osteopenia   Other diseases of nasal cavity and sinuses(478.19) 06/02/2009   Snores     Family History  Problem Relation Age of Onset   Pancreatic cancer Mother    Kidney cancer Mother    Heart disease Father    Lung cancer Father        smoker   Prostate cancer Other    Breast cancer Maternal Aunt        x 2   Colon cancer Neg Hx    Rectal cancer Neg Hx    Stomach cancer Neg Hx    Colon polyps Neg Hx    Esophageal cancer Neg Hx     Past Surgical History:  Procedure Laterality Date   BLADDER REPAIR  1993   post perforation during surgery for endometriosis   BREAST BIOPSY  0981,1914   rt and lt-negative   CESAREAN SECTION     x 2   CHOLECYSTECTOMY  2000   COLONOSCOPY  09-28-2004   tics only; 2016   complete hysterectomy  1985   following partial hysterectomy   DILATION AND CURETTAGE OF UTERUS     LAPAROSCOPIC ENDOMETRIOSIS FULGURATION  1993   with subsequent bladder perforation   ORIF ANKLE FRACTURE Left 10/03/2019   Procedure: OPEN REDUCTION INTERNAL FIXATION (ORIF) ANKLE FRACTURE;  Surgeon: Timothy Ford, MD;  Location: [facility];  Service: Orthopedics;  Laterality: Left;   PARTIAL HYSTERECTOMY     ROTATOR CUFF REPAIR Right    SHOULDER ARTHROSCOPY  2010   right   TONSILLECTOMY AND ADENOIDECTOMY Bilateral 08/09/2013   Procedure: BILATERAL TONSILLECTOMY ;  Surgeon: Lawence Press, MD;  Location: [facility];  Service: ENT;   Laterality: Bilateral;   Social History   Occupational History   Occupation: semi-retired  Tobacco Use   Smoking status: Never   Smokeless tobacco: Never  Vaping Use  Vaping status: Never Used  Substance and Sexual Activity   Alcohol use: Yes    Alcohol/week: 0.0 standard drinks of alcohol    Comment: occasionaly   Drug use: No   Sexual activity: Not on file

## 2023-10-04 ENCOUNTER — Encounter: Payer: Self-pay | Admitting: Bariatrics

## 2023-10-07 ENCOUNTER — Other Ambulatory Visit: Payer: Self-pay | Admitting: Medical Genetics

## 2023-10-13 ENCOUNTER — Other Ambulatory Visit: Payer: Self-pay | Admitting: Internal Medicine

## 2023-10-13 ENCOUNTER — Other Ambulatory Visit

## 2023-10-13 DIAGNOSIS — Z006 Encounter for examination for normal comparison and control in clinical research program: Secondary | ICD-10-CM

## 2023-10-14 ENCOUNTER — Ambulatory Visit: Admitting: Gastroenterology

## 2023-10-14 ENCOUNTER — Encounter: Payer: Self-pay | Admitting: Gastroenterology

## 2023-10-14 VITALS — BP 126/70 | HR 78 | Temp 97.9°F | Resp 17 | Wt 175.2 lb

## 2023-10-14 DIAGNOSIS — K648 Other hemorrhoids: Secondary | ICD-10-CM

## 2023-10-14 DIAGNOSIS — Z860100 Personal history of colon polyps, unspecified: Secondary | ICD-10-CM

## 2023-10-14 DIAGNOSIS — D125 Benign neoplasm of sigmoid colon: Secondary | ICD-10-CM | POA: Diagnosis not present

## 2023-10-14 DIAGNOSIS — D123 Benign neoplasm of transverse colon: Secondary | ICD-10-CM

## 2023-10-14 DIAGNOSIS — Z1211 Encounter for screening for malignant neoplasm of colon: Secondary | ICD-10-CM

## 2023-10-14 DIAGNOSIS — K5730 Diverticulosis of large intestine without perforation or abscess without bleeding: Secondary | ICD-10-CM | POA: Diagnosis not present

## 2023-10-14 DIAGNOSIS — K5520 Angiodysplasia of colon without hemorrhage: Secondary | ICD-10-CM

## 2023-10-14 MED ORDER — SODIUM CHLORIDE 0.9 % IV SOLN: 500.0000 mL | Freq: Once | INTRAVENOUS | Status: DC

## 2023-10-14 NOTE — Patient Instructions (Signed)

## 2023-10-14 NOTE — Progress Notes (Signed)
 Called to room to assist during endoscopic procedure.  Patient ID and intended procedure confirmed with present staff. Received instructions for my participation in the procedure from the performing physician.

## 2023-10-14 NOTE — Progress Notes (Signed)
 [facility] History and Physical   Primary Care Physician:  Lorenso Romance, MD   Reason for Procedure:   History of colon polyps  Plan:    colonoscopy     HPI: Jamie Jensen is a 78 y.o. female  here for colonoscopy surveillance - history of polyps removed in 2016 and 2022. 5 polyps reportedly removed 2022.   Aaron Aas Patient denies any bowel symptoms at this time. No family history of colon cancer known. Otherwise feels well without any cardiopulmonary symptoms.   I have discussed risks / benefits of anesthesia and endoscopic procedure with Jamie Jensen and they wish to proceed with the exams as outlined today.    Past Medical History:  Diagnosis Date   Anal fissure    Anemia    in the past with surgery but no transfusion   CELLULITIS/ABSCESS NOS 07/04/2009   Chronic sore throat    Diverticulosis    Endometriosis    Fatty liver    Gallstones    Hx of gastric ulcer    Hypertension    OSTEOPOROSIS 07/03/2007   osteopenia   Other diseases of nasal cavity and sinuses(478.19) 06/02/2009   Snores     Past Surgical History:  Procedure Laterality Date   BLADDER REPAIR  1993   post perforation during surgery for endometriosis   BREAST BIOPSY  1610,9604   rt and lt-negative   CESAREAN SECTION     x 2   CHOLECYSTECTOMY  2000   COLONOSCOPY  09-28-2004   tics only; 2016   complete hysterectomy  1985   following partial hysterectomy   DILATION AND CURETTAGE OF UTERUS     LAPAROSCOPIC ENDOMETRIOSIS FULGURATION  1993   with subsequent bladder perforation   ORIF ANKLE FRACTURE Left 10/03/2019   Procedure: OPEN REDUCTION INTERNAL FIXATION (ORIF) ANKLE FRACTURE;  Surgeon: Timothy Ford, MD;  Location: [facility];  Service: Orthopedics;  Laterality: Left;   PARTIAL HYSTERECTOMY     ROTATOR CUFF REPAIR Right    SHOULDER ARTHROSCOPY  2010   right   TONSILLECTOMY AND ADENOIDECTOMY Bilateral 08/09/2013   Procedure: BILATERAL TONSILLECTOMY ;  Surgeon: Lawence Press, MD;   Location: [facility];  Service: ENT;  Laterality: Bilateral;    Prior to Admission medications   Medication Sig Start Date End Date Taking? Authorizing Provider  atorvastatin  (LIPITOR) 10 MG tablet Take 1 tablet (10 mg total) by mouth daily. 05/21/23  Yes Nishan, Peter C, MD  nystatin cream (MYCOSTATIN) Apply topically. 03/18/22  Yes [provider]  triamterene-hydrochlorothiazide (DYAZIDE) 37.5-25 MG per capsule Take 1 capsule by mouth every morning.   Yes [provider]  Vitamin D , Ergocalciferol , (DRISDOL) 1.25 MG (50000 UNIT) CAPS capsule Take 50,000 Units by mouth. 09/23/23  Yes [provider]    Current Outpatient Medications  Medication Sig Dispense Refill   atorvastatin  (LIPITOR) 10 MG tablet Take 1 tablet (10 mg total) by mouth daily. 90 tablet 3   nystatin cream (MYCOSTATIN) Apply topically.     triamterene-hydrochlorothiazide (DYAZIDE) 37.5-25 MG per capsule Take 1 capsule by mouth every morning.     Vitamin D , Ergocalciferol , (DRISDOL) 1.25 MG (50000 UNIT) CAPS capsule Take 50,000 Units by mouth.     Current Facility-Administered Medications  Medication Dose Route Frequency Provider Last Rate Last Admin   0.9 %  sodium chloride  infusion  500 mL Intravenous Once Jamie Jensen, Lendon Queen, MD        Allergies as of 10/14/2023 - Review Complete  10/14/2023  Allergen Reaction Noted   Latex Other (See Comments) 10/26/2014    Family History  Problem Relation Age of Onset   Pancreatic cancer Mother    Kidney cancer Mother    Heart disease Father    Lung cancer Father        smoker   Prostate cancer Other    Breast cancer Maternal Aunt        x 2   Colon cancer Neg Hx    Rectal cancer Neg Hx    Stomach cancer Neg Hx    Colon polyps Neg Hx    Esophageal cancer Neg Hx     Social History   Socioeconomic History   Marital status: Married    Spouse name: Not on file   Number of children: 2   Years of education: Not on file    Highest education level: Not on file  Occupational History   Occupation: semi-retired  Tobacco Use   Smoking status: Never   Smokeless tobacco: Never  Vaping Use   Vaping status: Never Used  Substance and Sexual Activity   Alcohol use: Yes    Alcohol/week: 0.0 standard drinks of alcohol    Comment: occasionaly   Drug use: No   Sexual activity: Not on file  Other Topics Concern   Not on file  Social History Narrative   Not on file   Social Drivers of Health   Financial Resource Strain: Low Risk  (10/12/2022)   Received from [facility], [facility]   Overall Financial Resource Strain (CARDIA)    Difficulty of Paying Living Expenses: Not hard at all  Food Insecurity: No Food Insecurity (10/12/2022)   Received from [facility], [facility]   Hunger Vital Sign    Worried About Running Out of Food in the Last Year: Never true    Ran Out of Food in the Last Year: Never true  Transportation Needs: No Transportation Needs (10/12/2022)   Received from [facility], [facility]   PRAPARE - Transportation    Lack of Transportation (Medical): No    Lack of Transportation (Non-Medical): No  Physical Activity: Insufficiently Active (10/12/2022)   Received from [facility], [facility]   Exercise Vital Sign    Days of Exercise per Week: 4 days    Minutes of Exercise per Session: 30 min  Stress: No Stress Concern Present (10/12/2022)   Received from [facility], [facility] - Occupational Stress Questionnaire    Feeling of Stress : Not at all  Social Connections: Moderately Integrated (10/12/2022)   Received from [facility], [facility]   Social Network    How would you rate your social network (family, work, friends)?: Adequate participation with social networks  Intimate Partner Violence: Not At Risk (10/12/2022)   Received from [facility], [facility]   HITS    Over the last 12 months how often did your partner physically  hurt you?: Never    Over the last 12 months how often did your partner insult you or talk down to you?: Rarely    Over the last 12 months how often did your partner threaten you with physical harm?: Never    Over the last 12 months how often did your partner scream or curse at you?: Rarely    Review of Systems: All other review of systems negative except as mentioned in the HPI.  Physical Exam: Vital signs BP (!) 153/77   Pulse  83   Temp 97.9 F (36.6 C)   Wt 175 lb 3.2 oz (79.5 kg)   SpO2 96%   BMI 34.22 kg/m   General:   Alert,  Well-developed, pleasant and cooperative in NAD Lungs:  Clear throughout to auscultation.   Heart:  Regular rate and rhythm Abdomen:  Soft, nontender and nondistended.   Neuro/Psych:  Alert and cooperative. Normal mood and affect. A and O x 3  Christi Coward, MD [facility]

## 2023-10-14 NOTE — Progress Notes (Signed)
 Pt A/O x 3, gd SR's, pleased with anesthesia, report to RN

## 2023-10-14 NOTE — Op Note (Signed)
 [facility] Patient Name: Jamie Jensen Procedure Date: 10/14/2023 2:17 PM MRN: [id number] Endoscopist: Landon Pinion P. General [place] , MD, [id number] Age: 78 Referring MD:  Date of Birth: 09/10/1945 Gender: Female Account #: [id number] Procedure:                Colonoscopy Indications:              High risk colon cancer surveillance: Personal                            history of colonic polyps, 2016 - adenoma, 2022 - 5                            polyps removed Medicines:                Monitored Anesthesia Care Procedure:                Pre-Anesthesia Assessment:                           - Prior to the procedure, a History and Physical                            was performed, and patient medications and                            allergies were reviewed. The patient's tolerance of                            previous anesthesia was also reviewed. The risks                            and benefits of the procedure and the sedation                            options and risks were discussed with the patient.                            All questions were answered, and informed consent                            was obtained. Prior Anticoagulants: The patient has                            taken no anticoagulant or antiplatelet agents. ASA                            Grade Assessment: II - A patient with mild systemic                            disease. After reviewing the risks and benefits,                            the patient was deemed in satisfactory condition to  undergo the procedure.                           After obtaining informed consent, the colonoscope                            was passed under direct vision. Throughout the                            procedure, the patient's blood pressure, pulse, and                            oxygen saturations were monitored continuously. The                            [id number]-HQ190L Colonoscope [id number] was  introduced                            through the anus and advanced to the the cecum,                            identified by appendiceal orifice and ileocecal                            valve. The colonoscopy was performed without                            difficulty. The patient tolerated the procedure                            well. The quality of the bowel preparation was                            adequate. The ileocecal valve, appendiceal orifice,                            and rectum were photographed. Scope In: 2:30:16 PM Scope Out: 2:52:49 PM Scope Withdrawal Time: 0 hours 15 minutes 34 seconds  Total Procedure Duration: 0 hours 22 minutes 33 seconds  Findings:                 The perianal and digital rectal examinations were                            normal.                           A single small angiodysplastic lesion was found in                            the ascending colon.                           A 4 mm polyp was found in the transverse colon. The  polyp was sessile. The polyp was removed with a                            cold snare. Resection and retrieval were complete.                            I thought a photo of this was taken, but it was not.                           A 3 mm polyp was found in the sigmoid colon. The                            polyp was sessile. The polyp was removed with a                            cold snare. Resection and retrieval were complete.                           Multiple small-mouthed diverticula were found in                            the sigmoid colon. There was restricted mobility of                            the sigmoid colon associated with this.                           Internal hemorrhoids were found during retroflexion.                           The exam was otherwise without abnormality. Complications:            No immediate complications. Estimated blood loss:                             Minimal. Estimated Blood Loss:     Estimated blood loss was minimal. Impression:               - A single colonic angiodysplastic lesion.                           - One 4 mm polyp in the transverse colon, removed                            with a cold snare. Resected and retrieved.                           - One 3 mm polyp in the sigmoid colon, removed with                            a cold snare. Resected and retrieved.                           -  Diverticulosis in the sigmoid colon with                            restricted sigmoid colon.                           - Internal hemorrhoids.                           - The examination was otherwise normal. Recommendation:           - Patient has a contact number available for                            emergencies. The signs and symptoms of potential                            delayed complications were discussed with the                            patient. Return to normal activities tomorrow.                            Written discharge instructions were provided to the                            patient.                           - Resume previous diet.                           - Continue present medications.                           - Await pathology results.                           - Likely no further surveillance exams are needed                            given this result and the patient's age. [name], MD 10/14/2023 2:58:37 PM This report has been signed electronically.

## 2023-10-15 ENCOUNTER — Telehealth: Payer: Self-pay | Admitting: *Deleted

## 2023-10-15 NOTE — Telephone Encounter (Signed)
  Follow up Call-     10/14/2023    1:34 PM  Call back number  Post procedure Call Back phone  # [phone]  Permission to leave phone message Yes     Patient questions:  Do you have a fever, pain , or abdominal swelling? No. Pain Score  0 *  Have you tolerated food without any problems? Yes.    Have you been able to return to your normal activities? Yes.    Do you have any questions about your discharge instructions: Diet   No. Medications  No. Follow up visit  No.  Do you have questions or concerns about your Care? No.  Actions: * If pain score is 4 or above: No action needed, pain <4.

## 2023-10-19 ENCOUNTER — Ambulatory Visit: Payer: Self-pay | Admitting: Gastroenterology

## 2023-10-21 ENCOUNTER — Encounter: Payer: Self-pay | Admitting: Bariatrics

## 2023-10-21 ENCOUNTER — Ambulatory Visit (INDEPENDENT_AMBULATORY_CARE_PROVIDER_SITE_OTHER): Admitting: Bariatrics

## 2023-10-21 VITALS — BP 145/83 | HR 75 | Temp 98.1°F | Ht 60.0 in | Wt 171.0 lb

## 2023-10-21 DIAGNOSIS — Z6833 Body mass index (BMI) 33.0-33.9, adult: Secondary | ICD-10-CM

## 2023-10-21 DIAGNOSIS — E669 Obesity, unspecified: Secondary | ICD-10-CM

## 2023-10-21 DIAGNOSIS — R7303 Prediabetes: Secondary | ICD-10-CM

## 2023-10-21 DIAGNOSIS — E66811 Obesity, class 1: Secondary | ICD-10-CM

## 2023-10-21 NOTE — Progress Notes (Signed)
 WEIGHT SUMMARY AND BIOMETRICS  Weight Lost Since Last Visit: 4lb  Weight Gained Since Last Visit: 0   Vitals Temp: 98.1 F (36.7 C) BP: (!) 145/83 Pulse Rate: 75 SpO2: 97 %   Anthropometric Measurements Height: 5' (1.524 m) Weight: 171 lb (77.6 kg) BMI (Calculated): 33.4 Weight at Last Visit: 175lb Weight Lost Since Last Visit: 4lb Weight Gained Since Last Visit: 0 Starting Weight: 178lb Total Weight Loss (lbs): 7 lb (3.175 kg)   Body Composition  Body Fat %: 37.9 % Fat Mass (lbs): 64.8 lbs Muscle Mass (lbs): 101 lbs Total Body Water (lbs): 67.2 lbs Visceral Fat Rating : 12   Other Clinical Data Fasting: no Labs: no Today's Visit #: 3 Starting Date: 09/09/23    OBESITY Jamie Jensen is here to discuss her progress with her obesity treatment plan along with follow-up of her obesity related diagnoses.    Nutrition Plan: the Category 2 plan - 100% adherence.  Current exercise: walking  Interim History:  She is down 4 lbs since her last visit.  Eating all of the food on the plan., Protein intake is as prescribed, Meeting calorie goals., and Water intake is adequate.  Hunger is moderately controlled.  Cravings are moderately controlled.   Assessment/Plan:   Prediabetes Last A1c was 5.9  Medication(s): none  Lab Results  Component Value Date   HGBA1C 5.9 (H) 08/14/2023   Lab Results  Component Value Date   INSULIN  23.0 09/09/2023    Plan: Will minimize all refined carbohydrates both sweets and starches.  Will work on the plan and exercise.  She will continue her walking, gardening and her hand weights. Will keep protein, water, and fiber intake high.  Increase Polyunsaturated and Monounsaturated fats to increase satiety and encourage weight loss.  She will continue to meal plan. She will eat more meals at home.   Generalized Obesity:  Current BMI BMI (Calculated): 33.4   Jamie Jensen is currently in the action stage of change. As such, her goal is to continue with weight loss efforts.  She has agreed to the Category 2 plan.  Exercise goals: Older adults should follow the adult guidelines. When older adults cannot meet the adult guidelines, they should be as physically active as their abilities and conditions will allow.   Behavioral modification strategies: no meal skipping, better snacking choices, planning for success, increasing vegetables, decrease snacking , avoiding temptations, keep healthy foods in the home, weigh protein portions, measure portion sizes, and mindful eating.  Jamie Jensen has agreed to follow-up with our clinic in 3 weeks.       Objective:   VITALS: Per patient if applicable, see vitals. GENERAL: Alert and in no acute distress. CARDIOPULMONARY: No increased WOB. Speaking in clear sentences.  PSYCH: Pleasant and cooperative. Speech normal rate and rhythm. Affect is appropriate. Insight and judgement are appropriate. Attention is focused, linear, and appropriate.  NEURO: Oriented as arrived to appointment on time with no prompting.   Attestation Statements:   This was prepared with the assistance of Engineer, civil (consulting).  Occasional wrong-word or sound-a-like substitutions may have occurred due to the inherent limitations of voice recognition   Kirk Peper, DO

## 2023-10-22 LAB — GENECONNECT MOLECULAR SCREEN: Genetic Analysis Overall Interpretation: NEGATIVE

## 2023-11-05 ENCOUNTER — Ambulatory Visit (HOSPITAL_COMMUNITY)
Admission: RE | Admit: 2023-11-05 | Discharge: 2023-11-05 | Disposition: A | Discharge status: Home / Self Care | Source: Ambulatory Visit | Attending: Gastroenterology | Admitting: Gastroenterology

## 2023-11-05 DIAGNOSIS — K769 Liver disease, unspecified: Secondary | ICD-10-CM | POA: Insufficient documentation

## 2023-11-05 DIAGNOSIS — Z860100 Personal history of colon polyps, unspecified: Secondary | ICD-10-CM | POA: Diagnosis present

## 2023-11-05 DIAGNOSIS — E66812 Obesity, class 2: Secondary | ICD-10-CM | POA: Diagnosis present

## 2023-11-05 DIAGNOSIS — R7301 Impaired fasting glucose: Secondary | ICD-10-CM | POA: Diagnosis present

## 2023-11-05 DIAGNOSIS — K760 Fatty (change of) liver, not elsewhere classified: Secondary | ICD-10-CM | POA: Insufficient documentation

## 2023-11-05 DIAGNOSIS — R7303 Prediabetes: Secondary | ICD-10-CM | POA: Insufficient documentation

## 2023-11-05 DIAGNOSIS — Z6836 Body mass index (BMI) 36.0-36.9, adult: Secondary | ICD-10-CM | POA: Insufficient documentation

## 2023-11-06 ENCOUNTER — Telehealth: Payer: Self-pay | Admitting: Gastroenterology

## 2023-11-06 NOTE — Telephone Encounter (Signed)
 Pts US  results now in epic. Dr Leigh notified.

## 2023-11-06 NOTE — Telephone Encounter (Signed)
 Radiology department called and wanting to advise that this patient results are now I'm epic ready for Dr. Leigh to review at his earliest convinces. Please advise.

## 2023-11-08 ENCOUNTER — Ambulatory Visit: Payer: Self-pay | Admitting: Gastroenterology

## 2023-11-08 DIAGNOSIS — K7689 Other specified diseases of liver: Secondary | ICD-10-CM

## 2023-11-08 DIAGNOSIS — K769 Liver disease, unspecified: Secondary | ICD-10-CM

## 2023-11-08 DIAGNOSIS — R932 Abnormal findings on diagnostic imaging of liver and biliary tract: Secondary | ICD-10-CM

## 2023-11-09 NOTE — Telephone Encounter (Signed)
 Can someone please help call the patient and relay the following: - follow up ultrasound some some small cysts in the liver and another small lesion in the left lobe of her liver. Very slightly increased in size from previous. Hopefully this is a benign lesion but MRI is recommended to further evaluate. Can you please order MRI liver with and without contrast to evaluate. There is questionable changes of cirrhosis on the US  as well but really hard to say if she has this based on the US , please tell her this is not definitive by any means and MRI will look for changes of that as well. Can you also have her come to the lab for an AFP level blood draw and order that. Thanks

## 2023-11-10 ENCOUNTER — Other Ambulatory Visit (INDEPENDENT_AMBULATORY_CARE_PROVIDER_SITE_OTHER)

## 2023-11-10 DIAGNOSIS — K769 Liver disease, unspecified: Secondary | ICD-10-CM | POA: Diagnosis not present

## 2023-11-10 DIAGNOSIS — R932 Abnormal findings on diagnostic imaging of liver and biliary tract: Secondary | ICD-10-CM

## 2023-11-10 DIAGNOSIS — K7689 Other specified diseases of liver: Secondary | ICD-10-CM

## 2023-11-10 NOTE — Telephone Encounter (Signed)
 Me    11/10/23 11:07 AM Result Note Patient has been advised of u/s showing some liver cyst and a liver lesion as well as the possibility of cirrhosis. Patient is advised that radiology has recommended MRI liver for further evaluation. MRI scheduled at [facility] on 11/11/23 at 5 pm, 445 pm arrival, NPO 4 hours prior. Patient verbalizes understanding. Additionally, she is asked to come to our lab for bloodwork; order entered in EPIC.

## 2023-11-11 ENCOUNTER — Ambulatory Visit (HOSPITAL_COMMUNITY)
Admission: RE | Admit: 2023-11-11 | Discharge: 2023-11-11 | Disposition: A | Discharge status: Home / Self Care | Source: Ambulatory Visit | Attending: Gastroenterology | Admitting: Gastroenterology

## 2023-11-11 DIAGNOSIS — K7689 Other specified diseases of liver: Secondary | ICD-10-CM | POA: Diagnosis present

## 2023-11-11 DIAGNOSIS — K769 Liver disease, unspecified: Secondary | ICD-10-CM | POA: Diagnosis present

## 2023-11-11 LAB — AFP TUMOR MARKER: AFP-Tumor Marker: 2 ng/mL

## 2023-11-11 MED ORDER — GADOBUTROL 1 MMOL/ML IV SOLN
7.5000 mL | Freq: Once | INTRAVENOUS | Status: AC | PRN
  Administered 2023-11-11: 7.5 mL via INTRAVENOUS

## 2023-11-12 ENCOUNTER — Encounter: Payer: Self-pay | Admitting: Bariatrics

## 2023-11-12 ENCOUNTER — Ambulatory Visit: Payer: Self-pay | Admitting: Gastroenterology

## 2023-11-12 ENCOUNTER — Ambulatory Visit (INDEPENDENT_AMBULATORY_CARE_PROVIDER_SITE_OTHER): Admitting: Bariatrics

## 2023-11-12 VITALS — BP 130/83 | HR 74 | Temp 97.9°F | Ht 60.0 in | Wt 167.0 lb

## 2023-11-12 DIAGNOSIS — E669 Obesity, unspecified: Secondary | ICD-10-CM | POA: Diagnosis not present

## 2023-11-12 DIAGNOSIS — Z6832 Body mass index (BMI) 32.0-32.9, adult: Secondary | ICD-10-CM

## 2023-11-12 DIAGNOSIS — E66811 Obesity, class 1: Secondary | ICD-10-CM

## 2023-11-12 DIAGNOSIS — R7303 Prediabetes: Secondary | ICD-10-CM

## 2023-11-12 NOTE — Progress Notes (Signed)
 WEIGHT SUMMARY AND BIOMETRICS  Weight Lost Since Last Visit: 4lb  Weight Gained Since Last Visit: 0   Vitals Temp: 97.9 F (36.6 C) BP: 130/83 Pulse Rate: 74 SpO2: 97 %   Anthropometric Measurements Height: 5' (1.524 m) Weight: 167 lb (75.8 kg) BMI (Calculated): 32.62 Weight at Last Visit: 171lb Weight Lost Since Last Visit: 4lb Weight Gained Since Last Visit: 0 Starting Weight: 178lb Total Weight Loss (lbs): 11 lb (4.99 kg)   Body Composition  Body Fat %: 35 % Fat Mass (lbs): 58.6 lbs Muscle Mass (lbs): 103.4 lbs Total Body Water (lbs): 65.6 lbs Visceral Fat Rating : 11   Other Clinical Data Fasting: yes Labs: no Today's Visit #: 4 Starting Date: 09/09/23    OBESITY Kinda is here to discuss her progress with her obesity treatment plan along with follow-up of her obesity related diagnoses.    Nutrition Plan: the Category 2 plan - 0% adherence.  Current exercise: yard work  Interim History:  She is down 4 lbs since her last visit.  Eating all of the food on the plan., Protein intake is as prescribed, Is skipping meals, and Water intake is adequate.  Hunger is moderately controlled.  Cravings are moderately controlled.  Assessment/Plan:   Prediabetes Last A1c was 5.9  Medication(s): none Lab Results  Component Value Date   HGBA1C 5.9 (H) 08/14/2023   Lab Results  Component Value Date   INSULIN  23.0 09/09/2023    Plan: Will minimize all refined carbohydrates both sweets and starches.  Will work on the plan and exercise.  Will keep protein, water, and fiber intake high.  Increase Polyunsaturated and Monounsaturated fats to increase satiety and encourage weight loss.  Information sheets for healthier protein and carbohydrates.  She will continue working on finding healthier ways to eat her evening meals.  Suggestions were  discussed.     Generalized Obesity: Current BMI BMI (Calculated): 32.62    Shanele is currently in the action stage of change. As such, her goal is to continue with weight loss efforts.  She has agreed to the Category 2 plan.  Exercise goals: Older adults should determine their level of effort for physical activity relative to their level of fitness.   Behavioral modification strategies: increasing lean protein intake, meal planning , increasing vegetables, get rid of junk food in the home, decrease snacking , ways to avoid night time snacking, keep healthy foods in the home, weigh protein portions, and mindful eating.  Enriqueta has agreed to follow-up with our clinic in 2 weeks.      Objective:   VITALS: Per patient if applicable, see vitals. GENERAL: Alert and in no acute distress. CARDIOPULMONARY: No increased WOB. Speaking in clear sentences.  PSYCH: Pleasant and cooperative. Speech normal rate and rhythm. Affect is appropriate. Insight and judgement are appropriate. Attention is focused, linear, and appropriate.  NEURO:  Oriented as arrived to appointment on time with no prompting.   Attestation Statements:   This was prepared with the assistance of Engineer, civil (consulting).  Occasional wrong-word or sound-a-like substitutions may have occurred due to the inherent limitations of voice recognition   Clayborne Daring, DO

## 2023-12-08 ENCOUNTER — Ambulatory Visit (INDEPENDENT_AMBULATORY_CARE_PROVIDER_SITE_OTHER): Admitting: Bariatrics

## 2023-12-08 ENCOUNTER — Encounter: Payer: Self-pay | Admitting: Bariatrics

## 2023-12-08 VITALS — BP 135/66 | HR 66 | Temp 97.9°F | Ht 60.0 in | Wt 166.0 lb

## 2023-12-08 DIAGNOSIS — E669 Obesity, unspecified: Secondary | ICD-10-CM | POA: Diagnosis not present

## 2023-12-08 DIAGNOSIS — Z6832 Body mass index (BMI) 32.0-32.9, adult: Secondary | ICD-10-CM | POA: Diagnosis not present

## 2023-12-08 DIAGNOSIS — R7303 Prediabetes: Secondary | ICD-10-CM | POA: Diagnosis not present

## 2023-12-08 DIAGNOSIS — E66811 Obesity, class 1: Secondary | ICD-10-CM

## 2023-12-08 NOTE — Progress Notes (Signed)
 WEIGHT SUMMARY AND BIOMETRICS  Weight Lost Since Last Visit: 1lb  Weight Gained Since Last Visit: 0lb   Vitals Temp: 97.9 F (36.6 C) Pulse Rate: 66 SpO2: 96 %   Anthropometric Measurements Height: 5' (1.524 m) Weight: 166 lb (75.3 kg) BMI (Calculated): 32.42 Weight at Last Visit: 167lb Weight Lost Since Last Visit: 1lb Weight Gained Since Last Visit: 0lb Starting Weight: 178lb Total Weight Loss (lbs): 12 lb (5.443 kg)   Body Composition  Body Fat %: 36.2 % Fat Mass (lbs): 60 lbs Muscle Mass (lbs): 100.6 lbs Total Body Water (lbs): 66 lbs Visceral Fat Rating : 12   Other Clinical Data Fasting: Yes Labs: No Today's Visit #: 5 Starting Date: 09/09/23    OBESITY Makaylah is here to discuss her progress with her obesity treatment plan along with follow-up of her obesity related diagnoses.    Nutrition Plan: the Category 2 plan - 50% adherence.  Current exercise: walking  Interim History:  She is down 1 lb since her last visit. She has been under more stress. She has not been eating out more.  Eating all of the food on the plan., Protein intake is as prescribed, Is skipping meals, and Water intake is adequate.  Hunger is poorly controlled.  Cravings are moderately controlled.  Assessment/Plan:   Prediabetes Last A1c was 5.9  Medication(s): none   Lab Results  Component Value Date   HGBA1C 5.9 (H) 08/14/2023   Lab Results  Component Value Date   INSULIN  23.0 09/09/2023    Plan: Will minimize all refined carbohydrates both sweets and starches.  Will work on the plan and exercise.  Consider both aerobic and resistance training.  Will keep protein, water, and fiber intake high.  Increase Polyunsaturated and Monounsaturated fats to increase satiety and encourage weight loss.  Aim for 7 to 9 hours of sleep nightly.  She will get back to  her plan and track her calories on a regular basis.  She will eat out less.    Generalized Obesity: Current BMI BMI (Calculated): 32.42   Pharmacotherapy Plan No medications at this time.   Silver is currently in the action stage of change. As such, her goal is to continue with weight loss efforts.  She has agreed to the Category 2 plan and keeping a food journal with goal of 1,200 calories and 80 to 90 grams of protein daily.  Exercise goals: Older adults should determine their level of effort for physical activity relative to their level of fitness.   Behavioral modification strategies: increasing lean protein intake, no meal skipping, meal planning , increase water intake, better snacking choices, planning for success, avoiding temptations, keep healthy foods in the home, weigh protein portions, measure portion sizes, and mindful eating.  Shelbia has agreed to follow-up with our clinic in 4 weeks.    Objective:   VITALS: Per  patient if applicable, see vitals. GENERAL: Alert and in no acute distress. CARDIOPULMONARY: No increased WOB. Speaking in clear sentences.  PSYCH: Pleasant and cooperative. Speech normal rate and rhythm. Affect is appropriate. Insight and judgement are appropriate. Attention is focused, linear, and appropriate.  NEURO: Oriented as arrived to appointment on time with no prompting.   Attestation Statements:   This was prepared with the assistance of Engineer, civil (consulting).  Occasional wrong-word or sound-a-like substitutions may have occurred due to the inherent limitations of voice recognition   Clayborne Daring, DO

## 2023-12-23 ENCOUNTER — Encounter: Payer: Self-pay | Admitting: Gastroenterology

## 2023-12-23 ENCOUNTER — Ambulatory Visit (INDEPENDENT_AMBULATORY_CARE_PROVIDER_SITE_OTHER): Admitting: Gastroenterology

## 2023-12-23 ENCOUNTER — Other Ambulatory Visit (INDEPENDENT_AMBULATORY_CARE_PROVIDER_SITE_OTHER)

## 2023-12-23 VITALS — BP 128/60 | HR 64 | Ht 60.0 in | Wt 160.6 lb

## 2023-12-23 DIAGNOSIS — R932 Abnormal findings on diagnostic imaging of liver and biliary tract: Secondary | ICD-10-CM

## 2023-12-23 DIAGNOSIS — K760 Fatty (change of) liver, not elsewhere classified: Secondary | ICD-10-CM

## 2023-12-23 DIAGNOSIS — Z6831 Body mass index (BMI) 31.0-31.9, adult: Secondary | ICD-10-CM

## 2023-12-23 LAB — IBC + FERRITIN
Ferritin: 147.3 ng/mL (ref 10.0–291.0)
Iron: 96 ug/dL (ref 42–145)
Saturation Ratios: 25.7 % (ref 20.0–50.0)
TIBC: 373.8 ug/dL (ref 250.0–450.0)
Transferrin: 267 mg/dL (ref 212.0–360.0)

## 2023-12-23 LAB — CBC WITH DIFFERENTIAL/PLATELET
Basophils Absolute: 0.1 K/uL (ref 0.0–0.1)
Basophils Relative: 0.6 % (ref 0.0–3.0)
Eosinophils Absolute: 0.2 K/uL (ref 0.0–0.7)
Eosinophils Relative: 1.8 % (ref 0.0–5.0)
HCT: 42.8 % (ref 36.0–46.0)
Hemoglobin: 14.5 g/dL (ref 12.0–15.0)
Lymphocytes Relative: 23.6 % (ref 12.0–46.0)
Lymphs Abs: 2.2 K/uL (ref 0.7–4.0)
MCHC: 34 g/dL (ref 30.0–36.0)
MCV: 89.9 fl (ref 78.0–100.0)
Monocytes Absolute: 0.9 K/uL (ref 0.1–1.0)
Monocytes Relative: 9.3 % (ref 3.0–12.0)
Neutro Abs: 6.1 K/uL (ref 1.4–7.7)
Neutrophils Relative %: 64.7 % (ref 43.0–77.0)
Platelets: 272 K/uL (ref 150.0–400.0)
RBC: 4.76 Mil/uL (ref 3.87–5.11)
RDW: 13.1 % (ref 11.5–15.5)
WBC: 9.4 K/uL (ref 4.0–10.5)

## 2023-12-23 LAB — PROTIME-INR
INR: 1.2 ratio — ABNORMAL HIGH (ref 0.8–1.0)
Prothrombin Time: 12.6 s (ref 9.6–13.1)

## 2023-12-23 NOTE — Progress Notes (Signed)
 HPI :  78 year old female here for follow-up for fatty liver and suspected cirrhosis.  Recall she was seen by Dr. Delford of cardiology, had a cardiac CT done to get calcium  score, incidentally noted to have a 13 mm lesion in the liver.  This was followed up with a right upper quadrant ultrasound in January 2025.  There are few benign hepatic cysts noted as well as a 1.5 to 1.6 cm lesion, not definitive of what it represents based on radiology report.  Hepatic steatosis also noted.  She denied any history of liver disease.  Looking at labs in care everywhere, at [facility] she had labs in June 2024, AST 19, ALT 29, alk phos 78, bilirubin normal.  Platelet count 303.   In reviewing prior imaging she had a CT scan of her liver in January 2024 showing hepatic steatosis and small benign-appearing liver lesions.  I called radiology and spoke with Dr. Joan who reviewed prior imaging with me and he can see the suspected liver lesion dating back to 2015-2016.   She denies any alcohol use at all.  Her weight fluctuates anywhere from 5 to 8 pounds, BMI has fluctuated around 36.  Hemoglobin A1c last year was 5.9, prediabetic.  She does drink coffee routinely.   Since our last visit I obtained a repeat ultrasound on July 2 to further evaluate prior ultrasound findings and there was a reported 26 mm hypoechoic mass noted.  Also hepatic steatosis and suspected early changes of cirrhosis.  This was subsequently evaluated with an MRI of the liver on July 8.  There was no lesions concerning for [facility], she had small hepatic cyst and hemangiomas.  The MRI did show suspected cirrhotic changes.  Her spleen is normal in size.  Historically her LFTs have been normal and platelet count is normal.  She has never had any decompensating event of her liver.  After our last visit she was referred to the weight loss center.  She states she is on a restricted 1200-calorie diet per day over the past few months and has dropped almost  25 pounds.  She is also been very active and walking a few miles daily.  She continues to drink coffee every morning.  She is feeling well today.  We reviewed the findings of her exams thus far.  Of note since her last visit she had a colonoscopy with us  in June and had a few small polyps removed as outlined below    Prior workup: CT abdomen / pelvis 05/07/2022: IMPRESSION: 1. The appendix is dilated up to 8 mm diameter (compared to 5 mm previously). There appears to be transmural hyperenhancement in the wall of the appendix and despite good opacification of distal small bowel and right colon, the dependent appendix is non-opacified with a diffusely fluid-filled lumen. Subtle periappendiceal edema is evident. Imaging features raise concern for acute appendicitis. No evidence for perforation or abscess. 2. Hepatic steatosis. 3. Stable 14 mm right adrenal nodule compatible with benign etiology such as lipid poor adenoma. No followup imaging is recommended. 4. Left colonic diverticulosis without diverticulitis. 5.  Aortic Atherosclerosis (ICD10-I70.0).     Cardiac CT 05/21/2023: IMPRESSION: 13 mm focus of parenchymal low attenuation within the right lobe of the liver. Correlation with nonemergent hepatic ultrasound is recommended to further exclude the presence of an underlying hepatic Mass   IMPRESSION: 1. Coronary calcium  score of 87.3. This was 58th percentile for age-, race-, and sex-matched controls.     RUQ US  06/03/23:  FINDINGS: The liver demonstrates a increased echotexture without intrahepatic biliary dilatation. There are two simple cysts visualized within the left lobe, largest measuring 1.1 cm. A 1.6 cm avascular lesion with a hypoechoic lesion with an echogenic center is also visualized within the left hepatic lobe. There is normal hepatopetal flow visualized within the main portal vein.   The gallbladder is surgically absent. The common bile duct measures 0.4  cm.   IMPRESSION: 1. Increased hepatic echotexture, most commonly seen with steatosis. Correlation with LFT's is recommended.   2. Three left hepatic lobe lesions; two simple cysts and a 1.6 cm avascular hypoechoic lesion with an echogenic center.   3.  Surgically absent gallbladder.  No biliary dilatation.     10/11/22 - [facility] - AST 19, ALT 29, AP 78, T bil 0.5, plt 303, Hgb 13.7, MCV 92    High risk colon cancer surveillance: Personal history of colonic polyps, 2016 - adenoma, 2022 - 5 polyps removed Colonoscopy 10/14/23: - The perianal and digital rectal examinations were normal. - A single small angiodysplastic lesion was found in the ascending colon. - A 4 mm polyp was found in the transverse colon. The polyp was sessile. The polyp was removed with a cold snare. Resection and retrieval were complete. I thought a photo of this was taken, but it was not. - A 3 mm polyp was found in the sigmoid colon. The polyp was sessile. The polyp was removed with a cold snare. Resection and retrieval were complete. - Multiple small-mouthed diverticula were found in the sigmoid colon. There was restricted mobility of the sigmoid colon associated with this. - Internal hemorrhoids were found during retroflexion. - The exam was otherwise without abnormality.  FINAL DIAGNOSIS        1. Surgical [P], colon, sigmoid and transverse, polyp (2) :       -  TUBULAR ADENOMA, FRAGMENTS.    RUQ US  11/05/23: IMPRESSION: 1. There is a 26 mm hypoechoic mass along the hepatic dome in the LEFT liver. This is indeterminate. Recommend further evaluation with dedicated MRI abdomen with and without contrast. 2. Morphologic changes of liver which could reflect underlying hepatic steatosis with possible early cirrhotic changes.   MRI liver 11/11/23: LIVER: Macronodular hepatic contour with enlargement of the caudate, suggesting early cirrhosis. 13 mm cyst in segment 4a (series 18, image 18). 11 mm hemangioma in segment  4a (series 9, image 23). No lesions suspicious for HCC.    Past Medical History:  Diagnosis Date   Anal fissure    Anemia    in the past with surgery but no transfusion   CELLULITIS/ABSCESS NOS 07/04/2009   Chronic sore throat    Diverticulosis    Endometriosis    Fatty liver    Gallstones    Hx of gastric ulcer    Hypertension    OSTEOPOROSIS 07/03/2007   osteopenia   Other diseases of nasal cavity and sinuses(478.19) 06/02/2009   Snores      Past Surgical History:  Procedure Laterality Date   BLADDER REPAIR  1993   post perforation during surgery for endometriosis   BREAST BIOPSY  8003,8000   rt and lt-negative   CESAREAN SECTION     x 2   CHOLECYSTECTOMY  2000   COLONOSCOPY  09-28-2004   tics only; 2016   complete hysterectomy  1985   following partial hysterectomy   DILATION AND CURETTAGE OF UTERUS     LAPAROSCOPIC ENDOMETRIOSIS FULGURATION  1993   with subsequent bladder  perforation   ORIF ANKLE FRACTURE Left 10/03/2019   Procedure: OPEN REDUCTION INTERNAL FIXATION (ORIF) ANKLE FRACTURE;  Surgeon: Harden Jerona GAILS, MD;  Location: [facility] OR;  Service: Orthopedics;  Laterality: Left;   PARTIAL HYSTERECTOMY     ROTATOR CUFF REPAIR Right    SHOULDER ARTHROSCOPY  2010   right   TONSILLECTOMY AND ADENOIDECTOMY Bilateral 08/09/2013   Procedure: BILATERAL TONSILLECTOMY ;  Surgeon: Ana LELON Moccasin, MD;  Location: [facility];  Service: ENT;  Laterality: Bilateral;   Family History  Problem Relation Age of Onset   Pancreatic cancer Mother    Kidney cancer Mother    Heart disease Father    Lung cancer Father        smoker   Prostate cancer Other    Breast cancer Maternal Aunt        x 2   Colon cancer Neg Hx    Rectal cancer Neg Hx    Stomach cancer Neg Hx    Colon polyps Neg Hx    Esophageal cancer Neg Hx    Social History   Tobacco Use   Smoking status: Never   Smokeless tobacco: Never  Vaping Use   Vaping status: Never Used  Substance Use Topics    Alcohol use: Yes    Alcohol/week: 0.0 standard drinks of alcohol    Comment: occasionaly   Drug use: No   Current Outpatient Medications  Medication Sig Dispense Refill   atorvastatin  (LIPITOR) 10 MG tablet Take 1 tablet (10 mg total) by mouth daily. 90 tablet 3   nystatin cream (MYCOSTATIN) Apply topically.     triamterene-hydrochlorothiazide (DYAZIDE) 37.5-25 MG per capsule Take 1 capsule by mouth every morning.     Vitamin D , Ergocalciferol , (DRISDOL) 1.25 MG (50000 UNIT) CAPS capsule Take 50,000 Units by mouth.     No current facility-administered medications for this visit.   Allergies  Allergen Reactions   Latex Other (See Comments)    Blisters      Review of Systems: All systems reviewed and negative except where noted in HPI.   Lab Results  Component Value Date   WBC 9.4 12/23/2023   HGB 14.5 12/23/2023   HCT 42.8 12/23/2023   MCV 89.9 12/23/2023   PLT 272.0 12/23/2023   Lab Results  Component Value Date   ALT 27 08/14/2023   AST 21 08/14/2023   ALKPHOS 73 08/14/2023   BILITOT 0.6 08/14/2023   Fibrosis 4 Score = 1.11  Fib-4 interpretation is not validated for people under 35 or over 66 years of age. However, scores under 2.0 are generally considered low risk.   Physical Exam: BP 128/60   Pulse 64   Ht 5' (1.524 m)   Wt 160 lb 9.6 oz (72.8 kg)   BMI 31.37 kg/m  Constitutional: Pleasant,well-developed, female in no acute distress. Neurological: Alert and oriented to person place and time. Psychiatric: Normal mood and affect. Behavior is normal.   ASSESSMENT: 78 y.o. female here for assessment of the following  1. Abnormal findings on diagnostic imaging of liver and biliary tract   2. Metabolic dysfunction-associated steatotic liver disease (MASLD)    As above, further imaging to evaluate lesion noted on cardiac CT led to findings of fatty liver and raises question of early cirrhosis.  There are no concerning mass lesions in her liver based on MRI,  this is reassuring.  We reviewed the findings of MASLD, risks for cirrhosis.  We have discussed what cirrhosis is, risks for  decompensation and HCC.  I am a bit surprised by her MRI findings, her fib 4 is low, which would indicate low risk for cirrhosis.  She does have subtle changes of cirrhosis on both ultrasound and MRI however again her platelet count is normal and spleen is also normal on imaging.  While I cannot be sure if she has cirrhosis, if she does, she is certainly compensated.  Hopefully this never causes her any problems but will need to be monitored over time at this point.  In regards to management of MASLD she has done a great job with weight loss via diet and exercise and has lost a significant amount of weight since have last seen her.  Hopefully this will help prevent further fibrotic change.  She will continue routine coffee intake as that can help prevent fibrotic change.  Moving forward, given it is possible she has compensated cirrhosis, I will send labs to evaluate for other chronic liver diseases to make sure nothing else is going on.  We will check her immunity to hepatitis A and B and vaccinate if needed.  I do not think she needs an EGD right now for varices screening given her platelets are greater than 150, I think the likelihood of that would be quite low.  I will re check her platelets and check INR as well today.  I recommend a repeat right upper quadrant ultrasound in January for [facility] screening.  We discussed that the only way to really definitively tell if she has cirrhosis would be with a liver biopsy, however I do not think that is going to change management right now and would hold off on that.  We may be getting FibroScan to our office in the next few months, if we have that I would pursue that which may help further clarify how much fibrotic change she has, short of doing a liver biopsy.  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  PLAN: - discussed possible / suspected cirrhosis, risks for decompensation and HCC - work on weight loss for fatty liver, doing great in this regard - encouraged coffee intake - labs to evaluate for chronic liver disease today - labs to check immunity to hep A / B, vaccinate if needed - recall RUQ US  in January - consider fibroscan once available in our office soon, no liver biopsy right now, likely won't change management - holding off on EGD if platelets > 150 - f/u 6 months if not sooner with issues  Marcey Naval, MD [facility]

## 2023-12-23 NOTE — Patient Instructions (Addendum)
 Please go to the lab in the basement of our building to have lab work done as you leave today. Hit B for basement when you get on the elevator.  When the doors open the lab is on your left.  We will call you with the results. Thank you.  You will be due for a liver ultrasound in January. We will remind you when it is time to schedule.  Please follow up in 6 months.  Thank you for entrusting me with your care and for choosing [facility], Dr. Elspeth Naval   _______________________________________________________  If your blood pressure at your visit was 140/90 or greater, please contact your primary care physician to follow up on this.  _______________________________________________________  If you are age 6 or older, your body mass index should be between 23-30. Your Body mass index is 31.37 kg/m. If this is out of the aforementioned range listed, please consider follow up with your Primary Care Provider.  If you are age 75 or younger, your body mass index should be between 19-25. Your Body mass index is 31.37 kg/m. If this is out of the aformentioned range listed, please consider follow up with your Primary Care Provider.   ________________________________________________________  The [facility] providers would like to encourage you to use MYCHART to communicate with providers for non-urgent requests or questions.  Due to long hold times on the telephone, sending your provider a message by [facility] may be a faster and more efficient way to get a response.  Please allow 48 business hours for a response.  Please remember that this is for non-urgent requests.  _______________________________________________________  [facility] is using a team-based approach to care.  Your team is made up of your doctor and two to three APPS. Our APPS (Nurse Practitioners and Physician Assistants) work with your physician to ensure care continuity for you. They are fully qualified  to address your health concerns and develop a treatment plan. They communicate directly with your gastroenterologist to care for you. Seeing the Advanced Practice Practitioners on your physician's team can help you by facilitating care more promptly, often allowing for earlier appointments, access to diagnostic testing, procedures, and other specialty referrals.

## 2023-12-31 LAB — MITOCHONDRIAL ANTIBODIES: Mitochondrial M2 Ab, IgG: <=20 U (ref <=20.0)

## 2023-12-31 LAB — HEPATITIS B SURFACE ANTIGEN: Hepatitis B Surface Ag: NONREACTIVE

## 2023-12-31 LAB — HEPATITIS C ANTIBODY: Hepatitis C Ab: NONREACTIVE

## 2023-12-31 LAB — ANTI-NUCLEAR AB-TITER (ANA TITER): ANA Titer 1: 1:80 {titer} — ABNORMAL HIGH

## 2023-12-31 LAB — IGG: IgG (Immunoglobin G), Serum: 1007 mg/dL (ref 600–1540)

## 2023-12-31 LAB — ANA: Anti Nuclear Antibody (ANA): POSITIVE — AB

## 2023-12-31 LAB — ALPHA-1-ANTITRYPSIN: A-1 Antitrypsin, Ser: 135 mg/dL (ref 83–199)

## 2023-12-31 LAB — HEPATITIS A ANTIBODY, TOTAL: Hepatitis A AB,Total: REACTIVE — AB

## 2023-12-31 LAB — HEPATITIS B SURFACE ANTIBODY,QUALITATIVE: Hep B S Ab: NONREACTIVE

## 2023-12-31 LAB — ANTI-SMOOTH MUSCLE ANTIBODY, IGG: Actin (Smooth Muscle) Antibody (IGG): <20 U (ref <20)

## 2024-01-01 ENCOUNTER — Ambulatory Visit: Payer: Self-pay | Admitting: Gastroenterology

## 2024-01-06 ENCOUNTER — Ambulatory Visit: Admitting: Bariatrics

## 2024-01-09 ENCOUNTER — Ambulatory Visit: Admitting: Gastroenterology

## 2024-01-09 DIAGNOSIS — Z23 Encounter for immunization: Secondary | ICD-10-CM | POA: Diagnosis not present

## 2024-01-28 ENCOUNTER — Ambulatory Visit: Admitting: Bariatrics

## 2024-02-09 ENCOUNTER — Ambulatory Visit (INDEPENDENT_AMBULATORY_CARE_PROVIDER_SITE_OTHER)

## 2024-02-09 DIAGNOSIS — Z23 Encounter for immunization: Secondary | ICD-10-CM | POA: Diagnosis not present

## 2024-02-10 ENCOUNTER — Encounter: Payer: Self-pay | Admitting: Bariatrics

## 2024-02-10 ENCOUNTER — Ambulatory Visit: Admitting: Bariatrics

## 2024-02-10 VITALS — BP 137/83 | HR 68 | Temp 98.1°F | Ht 60.0 in | Wt 163.0 lb

## 2024-02-10 DIAGNOSIS — Z6831 Body mass index (BMI) 31.0-31.9, adult: Secondary | ICD-10-CM | POA: Diagnosis not present

## 2024-02-10 DIAGNOSIS — E559 Vitamin D deficiency, unspecified: Secondary | ICD-10-CM | POA: Diagnosis not present

## 2024-02-10 DIAGNOSIS — K760 Fatty (change of) liver, not elsewhere classified: Secondary | ICD-10-CM | POA: Diagnosis not present

## 2024-02-10 DIAGNOSIS — E669 Obesity, unspecified: Secondary | ICD-10-CM

## 2024-02-10 DIAGNOSIS — E66811 Obesity, class 1: Secondary | ICD-10-CM

## 2024-02-10 NOTE — Progress Notes (Signed)
 WEIGHT SUMMARY AND BIOMETRICS  Weight Lost Since Last Visit: 3lb  Weight Gained Since Last Visit: 0   Vitals Temp: 98.1 F (36.7 C) BP: 137/83 Pulse Rate: 68 SpO2: 97 %   Anthropometric Measurements Height: 5' (1.524 m) Weight: 163 lb (73.9 kg) BMI (Calculated): 31.83 Weight at Last Visit: 166lb Weight Lost Since Last Visit: 3lb Weight Gained Since Last Visit: 0 Starting Weight: 178lb Total Weight Loss (lbs): 15 lb (6.804 kg)   Body Composition  Body Fat %: 36.8 % Fat Mass (lbs): 60.2 lbs Muscle Mass (lbs): 98.2 lbs Total Body Water (lbs): 66.2 lbs Visceral Fat Rating : 12   Other Clinical Data Fasting: no Labs: no Today's Visit #: 6 Starting Date: 09/09/23    OBESITY Jamie Jensen is here to discuss her progress with her obesity treatment plan along with follow-up of her obesity related diagnoses.    Nutrition Plan: the Category 2 plan - 0% adherence.  Current exercise: walking  Interim History:  Eating all of the food on the plan., Protein intake is as prescribed, and Water intake is adequate.  Hunger is moderately controlled.  Cravings are moderately controlled.  Assessment/Plan:   Vitamin D  Deficiency Vitamin D  is not at goal of 50.  Most recent vitamin D  level was 30.9. She is on  prescription ergocalciferol  50,000 IU weekly. Lab Results  Component Value Date   VD25OH 30.9 09/09/2023    Plan: Continue prescription vitamin D  50,000 IU weekly.   Hepatitis steatosis:   Fibrosis 4 Score = 1.13  Fib-4 interpretation is not validated for people under 35 or over 56 years of age. However, scores under 2.0 are generally considered low risk.   Plan:   Will see GI and have a Fibro-scan.    Generalized Obesity: Current BMI BMI (Calculated): 31.83    Jamie Jensen is currently in the action stage of change. As such, her goal is to continue  with weight loss efforts.  She has agreed to the Category 2 plan.  Exercise goals: Older adults with chronic conditions should understand whether and how their conditions affect their ability to do regular physical activity safely.  Behavioral modification strategies: increasing lean protein intake, no meal skipping, meal planning , increase water intake, better snacking choices, avoiding temptations, keep healthy foods in the home, measure portion sizes, and mindful eating.  Jamie Jensen has agreed to follow-up with our clinic in 4 weeks.      Objective:   VITALS: Per patient if applicable, see vitals. GENERAL: Alert and in no acute distress. CARDIOPULMONARY: No increased WOB. Speaking in clear sentences.  PSYCH: Pleasant and cooperative. Speech normal rate and rhythm. Affect is appropriate. Insight and judgement are appropriate. Attention is focused, linear, and appropriate.  NEURO: Oriented as arrived to appointment on time with no prompting.   Attestation Statements:   This was prepared with the assistance of Dragon  Medical Dictation.  Occasional wrong-word or sound-a-like substitutions may have occurred due to the inherent limitations of voice recognition   Jamie Daring, DO

## 2024-03-08 ENCOUNTER — Encounter: Payer: Self-pay | Admitting: Radiology

## 2024-03-11 ENCOUNTER — Ambulatory Visit: Admitting: Bariatrics

## 2024-03-22 ENCOUNTER — Ambulatory Visit (INDEPENDENT_AMBULATORY_CARE_PROVIDER_SITE_OTHER): Admitting: Bariatrics

## 2024-03-22 ENCOUNTER — Encounter: Payer: Self-pay | Admitting: Bariatrics

## 2024-03-22 VITALS — BP 128/86 | HR 88 | Ht 60.0 in | Wt 162.0 lb

## 2024-03-22 DIAGNOSIS — E559 Vitamin D deficiency, unspecified: Secondary | ICD-10-CM | POA: Diagnosis not present

## 2024-03-22 DIAGNOSIS — K760 Fatty (change of) liver, not elsewhere classified: Secondary | ICD-10-CM | POA: Diagnosis not present

## 2024-03-22 DIAGNOSIS — R7303 Prediabetes: Secondary | ICD-10-CM | POA: Diagnosis not present

## 2024-03-22 DIAGNOSIS — E669 Obesity, unspecified: Secondary | ICD-10-CM

## 2024-03-22 DIAGNOSIS — Z6831 Body mass index (BMI) 31.0-31.9, adult: Secondary | ICD-10-CM

## 2024-03-22 DIAGNOSIS — E66811 Obesity, class 1: Secondary | ICD-10-CM

## 2024-03-22 NOTE — Progress Notes (Signed)
 WEIGHT SUMMARY AND BIOMETRICS  Weight Lost Since Last Visit: 1lb  Weight Gained Since Last Visit: 0   Vitals BP: 128/86 Pulse Rate: 88 SpO2: 97 %   Anthropometric Measurements Height: 5' (1.524 m) Weight: 162 lb (73.5 kg) BMI (Calculated): 31.64 Weight at Last Visit: 163lb Weight Lost Since Last Visit: 1lb Weight Gained Since Last Visit: 0 Starting Weight: 178lb Total Weight Loss (lbs): 16 lb (7.258 kg)   Body Composition  Body Fat %: 37.5 % Fat Mass (lbs): 61 lbs Muscle Mass (lbs): 96.2 lbs Total Body Water (lbs): 65.4 lbs Visceral Fat Rating : 12   Other Clinical Data Fasting: no Labs: no Today's Visit #: 7 Starting Date: 09/09/23    OBESITY Shada is here to discuss her progress with her obesity treatment plan along with follow-up of her obesity related diagnoses.    Nutrition Plan: the Category 2 plan - 5% adherence.  Current exercise: walking and moving.  Interim History:  She is down 1 lb since her last visit.  Eating all of the food on the plan., Protein intake is as prescribed, Is skipping meals, Journaling consistently., Not journaling consistently., and Water intake is adequate.   Pharmacotherapy: Kirstein is not on any anti-obesity medications.  Hunger is moderately controlled.  Cravings are moderately controlled.  Assessment/Plan:   Vitamin D  Deficiency Vitamin D  is not at goal of 50.  Most recent vitamin D  level was 30.9. She is on  prescription ergocalciferol  50,000 IU weekly. Lab Results  Component Value Date   VD25OH 30.9 09/09/2023    Plan: Continue prescription vitamin D  50,000 IU weekly.   Prediabetes Last A1c was 5.9  Medication(s):none Lab Results  Component Value Date   HGBA1C 5.9 (H) 08/14/2023   Lab Results  Component Value Date   INSULIN  23.0 09/09/2023    Plan: Will minimize all refined  carbohydrates both sweets and starches.  Will work on the plan and exercise.  Consider both aerobic and resistance training.  Will keep protein, water, and fiber intake high.  Discussed strategies for Thanksgiving.  Will prepare more meals at home.   Hepatic steatosis:   Needs labs.   Plan:  Will check CMP.  Will continue weight loss.   Labs done today (CMP, Lipids, HgbA1c, insulin , vitamin D ).    Generalized Obesity: Current BMI BMI (Calculated): 31.64   Pharmacotherapy Plan No anti-obesity medications  Virdie is currently in the action stage of change. As such, her goal is to continue with weight loss efforts.  She has agreed to the Category 2 plan.  Exercise goals: Older adults with chronic conditions should understand whether and how their conditions affect their ability to do regular physical activity safely.  Behavioral modification strategies: increasing lean protein intake, decreasing simple carbohydrates , no meal skipping, decrease eating out, meal planning , increase water intake, better snacking choices, planning for success,  increasing vegetables, increasing fiber rich foods, avoiding temptations, keep healthy foods in the home, increase frequency of journaling, weigh protein portions, measure portion sizes, and mindful eating.  Amariss has agreed to follow-up with our clinic in 4 weeks.    Objective:   VITALS: Per patient if applicable, see vitals. GENERAL: Alert and in no acute distress. CARDIOPULMONARY: No increased WOB. Speaking in clear sentences.  PSYCH: Pleasant and cooperative. Speech normal rate and rhythm. Affect is appropriate. Insight and judgement are appropriate. Attention is focused, linear, and appropriate.  NEURO: Oriented as arrived to appointment on time with no prompting.   Attestation Statements:   This was prepared with the assistance of Engineer, Civil (consulting).  Occasional wrong-word or sound-a-like substitutions may have occurred due to  the inherent limitations of voice recognition   Clayborne Daring, DO

## 2024-03-23 ENCOUNTER — Encounter: Payer: Self-pay | Admitting: Gastroenterology

## 2024-03-23 ENCOUNTER — Ambulatory Visit: Payer: Self-pay | Admitting: Bariatrics

## 2024-03-23 LAB — HEMOGLOBIN A1C
Est. average glucose Bld gHb Est-mCnc: 114 mg/dL
Hgb A1c MFr Bld: 5.6 % (ref 4.8–5.6)

## 2024-03-23 LAB — COMPREHENSIVE METABOLIC PANEL WITH GFR
ALT: 20 IU/L (ref 0–32)
AST: 20 IU/L (ref 0–40)
Albumin: 4.8 g/dL (ref 3.8–4.8)
Alkaline Phosphatase: 100 IU/L (ref 49–135)
BUN/Creatinine Ratio: 16 (ref 12–28)
BUN: 14 mg/dL (ref 8–27)
Bilirubin Total: 0.5 mg/dL (ref 0.0–1.2)
CO2: 21 mmol/L (ref 20–29)
Calcium: 9.6 mg/dL (ref 8.7–10.3)
Chloride: 103 mmol/L (ref 96–106)
Creatinine, Ser: 0.9 mg/dL (ref 0.57–1.00)
Globulin, Total: 2.4 g/dL (ref 1.5–4.5)
Glucose: 89 mg/dL (ref 70–99)
Potassium: 4.1 mmol/L (ref 3.5–5.2)
Sodium: 139 mmol/L (ref 134–144)
Total Protein: 7.2 g/dL (ref 6.0–8.5)
eGFR: 66 mL/min/1.73 (ref >59)

## 2024-03-23 LAB — LIPID PANEL WITH LDL/HDL RATIO
Cholesterol, Total: 130 mg/dL (ref 100–199)
HDL: 57 mg/dL (ref >39)
LDL Chol Calc (NIH): 57 mg/dL (ref 0–99)
LDL/HDL Ratio: 1 ratio (ref 0.0–3.2)
Triglycerides: 83 mg/dL (ref 0–149)
VLDL Cholesterol Cal: 16 mg/dL (ref 5–40)

## 2024-03-23 LAB — INSULIN, RANDOM: INSULIN: 15.9 u[IU]/mL (ref 2.6–24.9)

## 2024-03-23 LAB — VITAMIN D 25 HYDROXY (VIT D DEFICIENCY, FRACTURES): Vit D, 25-Hydroxy: 45.1 ng/mL (ref 30.0–100.0)

## 2024-04-16 ENCOUNTER — Encounter: Payer: Self-pay | Admitting: Cardiovascular Disease

## 2024-05-05 ENCOUNTER — Telehealth: Payer: Self-pay

## 2024-05-05 DIAGNOSIS — K760 Fatty (change of) liver, not elsewhere classified: Secondary | ICD-10-CM

## 2024-05-05 DIAGNOSIS — R932 Abnormal findings on diagnostic imaging of liver and biliary tract: Secondary | ICD-10-CM

## 2024-05-05 NOTE — Telephone Encounter (Signed)
-----   Message from [facility] Clarita H sent at 12/23/2023  3:33 PM EDT ----- Regarding: RUQ Patient will be due for RUQ u/S in Jan 2026 for hcc screening

## 2024-05-05 NOTE — Telephone Encounter (Signed)
 Order placed for RUQ U/S. Message to patient and message to schedulers

## 2024-05-12 ENCOUNTER — Encounter: Payer: Self-pay | Admitting: Bariatrics

## 2024-05-12 ENCOUNTER — Ambulatory Visit: Admitting: Bariatrics

## 2024-05-12 VITALS — BP 117/75 | HR 74 | Temp 98.0°F | Ht 60.0 in | Wt 162.0 lb

## 2024-05-12 DIAGNOSIS — E669 Obesity, unspecified: Secondary | ICD-10-CM | POA: Diagnosis not present

## 2024-05-12 DIAGNOSIS — Z6831 Body mass index (BMI) 31.0-31.9, adult: Secondary | ICD-10-CM

## 2024-05-12 DIAGNOSIS — R7303 Prediabetes: Secondary | ICD-10-CM | POA: Diagnosis not present

## 2024-05-12 DIAGNOSIS — E6609 Other obesity due to excess calories: Secondary | ICD-10-CM

## 2024-05-12 NOTE — Progress Notes (Signed)
 "                                                                                                             WEIGHT SUMMARY AND BIOMETRICS  Weight Lost Since Last Visit: 0  Weight Gained Since Last Visit: 0   Vitals Temp: 98 F (36.7 C) BP: 117/75 Pulse Rate: 74 SpO2: 97 %   Anthropometric Measurements Height: 5' (1.524 m) Weight: 162 lb (73.5 kg) BMI (Calculated): 31.64 Weight at Last Visit: 162 lb Weight Lost Since Last Visit: 0 Weight Gained Since Last Visit: 0 Starting Weight: 178 lb Total Weight Loss (lbs): 16 lb (7.258 kg)   Body Composition  Body Fat %: 37.9 % Fat Mass (lbs): 61.6 lbs Muscle Mass (lbs): 95.8 lbs Total Body Water (lbs): 64 lbs Visceral Fat Rating : 12   Other Clinical Data Fasting: no Labs: no Today's Visit #: 8 Starting Date: 09/09/23    OBESITY Jamie Jensen is here to discuss her progress with her obesity treatment plan along with follow-up of her obesity related diagnoses.    Nutrition Plan: the Category 2 plan - 5% adherence.  Current exercise: none  Interim History:  Her weight remains the same over the holiday.  She has had a difficult time over the last month with her husband having a stroke and her daughter in the hospital.  She states that she is in survival mode and just trying to do which she can to maintain her weight.  She states she has been less active as well. Eating all of the food on the plan., Protein intake is as prescribed, Is skipping meals, and Water intake is inadequate.   Pharmacotherapy: Jamie Jensen is not on any anti-obesity medications Hunger is moderately controlled.  Cravings are moderately controlled.  Assessment/Plan:   Prediabetes Last A1c was 5.6  Medication(s): none Lab Results  Component Value Date   HGBA1C 5.6 03/22/2024   HGBA1C 5.9 (H) 08/14/2023   Lab Results  Component Value Date   INSULIN  15.9 03/22/2024   INSULIN  23.0 09/09/2023    Plan: Will minimize all refined carbohydrates  both sweets and starches.  Will work on the plan and exercise.  Consider both aerobic and resistance training.  Will keep protein, water, and fiber intake high.  Increase Polyunsaturated and Monounsaturated fats to increase satiety and encourage weight loss.  Aim for 7 to 9 hours of sleep nightly.  She will focus on sleep hygiene. She will get back to her walking and spending some time in nature to refresh her spirit and to help with her muscle mass. She will also resume some chair exercises. She will focus on getting about 80 g of protein per day.    Generalized Obesity: Current BMI BMI (Calculated): 31.64   Pharmacotherapy Plan No anti-obesity medications.   Jamie Jensen is currently in the action stage of change. As such, her goal is to continue with weight loss efforts.  She has agreed to the Category 2 plan.  Exercise goals: Older adults should determine their level of effort for  physical activity relative to their level of fitness.   Behavioral modification strategies: increasing lean protein intake, decreasing simple carbohydrates , no meal skipping, meal planning , increase water intake, better snacking choices, planning for success, increasing vegetables, keep healthy foods in the home, measure portion sizes, work on smaller portions, and mindful eating.  Jamie Jensen has agreed to follow-up with our clinic in 4 weeks.   Objective:   VITALS: Per patient if applicable, see vitals. GENERAL: Alert and in no acute distress. CARDIOPULMONARY: No increased WOB. Speaking in clear sentences.  PSYCH: Pleasant and cooperative. Speech normal rate and rhythm. Affect is appropriate. Insight and judgement are appropriate. Attention is focused, linear, and appropriate.  NEURO: Oriented as arrived to appointment on time with no prompting.   Attestation Statements:   This was prepared with the assistance of Engineer, Civil (consulting).  Occasional wrong-word or sound-a-like substitutions may have  occurred due to the inherent limitations of voice recognition.   Clayborne Daring, DO     "

## 2024-05-14 ENCOUNTER — Other Ambulatory Visit: Payer: Self-pay | Admitting: Cardiovascular Disease

## 2024-05-18 ENCOUNTER — Ambulatory Visit (HOSPITAL_COMMUNITY)
Admission: RE | Admit: 2024-05-18 | Discharge: 2024-05-18 | Disposition: A | Discharge status: Home / Self Care | Source: Ambulatory Visit | Attending: Gastroenterology | Admitting: Gastroenterology

## 2024-05-18 DIAGNOSIS — K760 Fatty (change of) liver, not elsewhere classified: Secondary | ICD-10-CM | POA: Diagnosis present

## 2024-05-18 DIAGNOSIS — R932 Abnormal findings on diagnostic imaging of liver and biliary tract: Secondary | ICD-10-CM | POA: Insufficient documentation

## 2024-05-19 ENCOUNTER — Ambulatory Visit: Payer: Self-pay | Admitting: Gastroenterology

## 2024-05-20 ENCOUNTER — Telehealth: Payer: Self-pay | Admitting: Gastroenterology

## 2024-05-20 NOTE — Progress Notes (Signed)
 CARDIOLOGY CONSULT NOTE       Patient ID: Jamie Jensen MRN: [id number] DOB/AGE: Jun 15, 1945 78 y.o.  Referring Physician: Ryter-Buddenhagen Primary Physician: Jamie Jensen HERO, MD Primary Cardiologist: Jamie Jensen Reason for Consultation: Chest pain    HPI:  79 y.o. with history of HTN, obesity, vasovagal syncope, Goiter. Previous atypical chest pain. Re established with me 05/14/23 Previously seen by us  in 2019. Had normal stress echo in 2014 ECG chronically abnormal with LAD and poor R wave progression. When I saw her in 2019 was concerned about floaters in eyes and her carotid duplex had minor plaque with no stenosis. She only takes diuretic for HTN. She has fractured her right ankle in past with surgical repair and sees Dr Jamie Jensen. Seen at urgent care September of this year with sternal pain radiating to RLQ abdomen Rx with Bentyl ? Irritable bowel   Lab review from primary with LDL 75 Office visit noted BP controlled and class 2 severe obesity  ECG chronically abnormal with poor R wave progression ICLBBB   CT abdomen ? Appendicitis done 05/09/22 Saw general surgery acutely but not appendicitis ? Scar tissue From prior endometrial surgery   Her husband has refractory trigeminal neuralgia and is a lot of work. Still strained relations with her 2 step children. 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 MRI with hepatic cyst hemangioma benign but ? Cirrhosis Referred to GI Saw Jamie Jensen. LFTls normal as were PLT;s No ETOH. Diagnosed with MASLD  Weight loss has been good.   No cardiac issues Still scared by diagnosis of cirrhosis     ROS All other systems reviewed and negative except as noted above  Past Medical History:  Diagnosis Date   Anal fissure    Anemia     in the past with surgery but no transfusion   CELLULITIS/ABSCESS NOS 07/04/2009   Chronic sore throat    Diverticulosis    Endometriosis    Fatty liver    Gallstones    Hx of gastric ulcer    Hypertension    OSTEOPOROSIS 07/03/2007   osteopenia   Other diseases of nasal cavity and sinuses(478.19) 06/02/2009   Snores     Family History  Problem Relation Age of Onset   Pancreatic cancer Mother    Kidney cancer Mother    Heart disease Father    Lung cancer Father        smoker   Prostate cancer Other    Breast cancer Maternal Aunt        x 2   Colon cancer Neg Hx    Rectal cancer Neg Hx    Stomach cancer Neg Hx    Colon polyps Neg Hx    Esophageal cancer Neg Hx     Social History   Socioeconomic History   Marital status: Married    Spouse name: Not on file   Number of children: 2   Years of education: Not on file   Highest education level: 12th grade  Occupational History   Occupation: semi-retired  Tobacco Use   Smoking status: Never   Smokeless tobacco: Never  Vaping Use   Vaping status: Never Used  Substance and Sexual Activity   Alcohol use: Yes  Alcohol/week: 0.0 standard drinks of alcohol    Comment: occasionaly   Drug use: No   Sexual activity: Not on file  Other Topics Concern   Not on file  Social History Narrative   Not on file   Social Drivers of Health   Tobacco Use: Low Risk (06/02/2024)   Patient History    Smoking Tobacco Use: Never    Smokeless Tobacco Use: Never    Passive Exposure: Not on file  Financial Resource Strain: Low Risk (06/01/2024)   Overall Financial Resource Strain (CARDIA)    Difficulty of Paying Living Expenses: Not hard at all  Food Insecurity: No Food Insecurity (06/01/2024)   Epic    Worried About Radiation Protection Practitioner of Food in the Last Year: Never true    Ran Out of Food in the Last Year: Never true  Transportation Needs: No Transportation Needs (06/01/2024)   Epic    Lack of Transportation (Medical): No    Lack of  Transportation (Non-Medical): No  Physical Activity: Insufficiently Active (06/01/2024)   Exercise Vital Sign    Days of Exercise per Week: 3 days    Minutes of Exercise per Session: 30 min  Stress: No Stress Concern Present (06/01/2024)   [employer] of Occupational Health - Occupational Stress Questionnaire    Feeling of Stress: Only a little  Social Connections: Socially Integrated (06/01/2024)   Social Connection and Isolation Panel    Frequency of Communication with Friends and Family: More than three times a week    Frequency of Social Gatherings with Friends and Family: Once a week    Attends Religious Services: More than 4 times per year    Active Member of [employer] or Organizations: Yes    Attends Engineer, Structural: More than 4 times per year    Marital Status: Married  Catering Manager Violence: Not At Risk (01/20/2024)   Received from [facility]   HITS    Over the last 12 months how often did your partner physically hurt you?: Never    Over the last 12 months how often did your partner insult you or talk down to you?: Never    Over the last 12 months how often did your partner threaten you with physical harm?: Never    Over the last 12 months how often did your partner scream or curse at you?: Never  Depression (PHQ2-9): Not on file  Alcohol Screen: Not on file  Housing: Unknown (06/01/2024)   Epic    Unable to Pay for Housing in the Last Year: No    Number of Times Moved in the Last Year: Not on file    Homeless in the Last Year: No  Utilities: Not At Risk (05/13/2024)   Received from [facility]    In the past 12 months has the electric, gas, oil, or water company threatened to shut off services in your home?: No  Health Literacy: Not on file    Past Surgical History:  Procedure Laterality Date   BLADDER REPAIR  1993   post perforation during surgery for endometriosis   BREAST BIOPSY  8003,8000   rt and lt-negative   CESAREAN SECTION     x 2    CHOLECYSTECTOMY  2000   COLONOSCOPY  09-28-2004   tics only; 2016   complete hysterectomy  1985   following partial hysterectomy   DILATION AND CURETTAGE OF UTERUS     LAPAROSCOPIC ENDOMETRIOSIS FULGURATION  1993   with subsequent  bladder perforation   ORIF ANKLE FRACTURE Left 10/03/2019   Procedure: OPEN REDUCTION INTERNAL FIXATION (ORIF) ANKLE FRACTURE;  Surgeon: Jamie Jensen Jerona GAILS, MD;  Location: [facility] OR;  Service: Orthopedics;  Laterality: Left;   PARTIAL HYSTERECTOMY     ROTATOR CUFF REPAIR Right    SHOULDER ARTHROSCOPY  2010   right   TONSILLECTOMY AND ADENOIDECTOMY Bilateral 08/09/2013   Procedure: BILATERAL TONSILLECTOMY ;  Surgeon: Ana LELON Moccasin, MD;  Location: [facility];  Service: ENT;  Laterality: Bilateral;      Current Outpatient Medications:    atorvastatin  (LIPITOR) 10 MG tablet, TAKE 1 TABLET(10 MG) BY MOUTH DAILY, Disp: 90 tablet, Rfl: 0   nystatin cream (MYCOSTATIN), Apply topically., Disp: , Rfl:    triamcinolone cream (KENALOG) 0.1 %, Apply topically., Disp: , Rfl:    triamterene-hydrochlorothiazide (DYAZIDE) 37.5-25 MG per capsule, Take 1 capsule by mouth every morning., Disp: , Rfl:    Vitamin D , Ergocalciferol , (DRISDOL) 1.25 MG (50000 UNIT) CAPS capsule, Take 50,000 Units by mouth., Disp: , Rfl:     Physical Exam: Blood pressure 123/68, pulse 75, height 5' (1.524 m), weight 171 lb (77.6 kg), SpO2 95%.    Affect appropriate Healthy:  appears stated age HEENT: normal Neck supple with no adenopathy JVP normal no bruits no thyromegaly Lungs clear with no wheezing and good diaphragmatic motion Heart:  S1/S2 no murmur, no rub, gallop or click PMI normal Abdomen: benighn, BS positve, no tenderness, no AAA no bruit.  No HSM or HJR Distal pulses intact with no bruits No edema Neuro non-focal Prior right ankle fracture with decreased ROM   Labs:   Lab Results  Component Value Date   WBC 9.4 12/23/2023   HGB 14.5 12/23/2023   HCT 42.8 12/23/2023    MCV 89.9 12/23/2023   PLT 272.0 12/23/2023   No results for input(s): NA, K, CL, CO2, BUN, CREATININE, CALCIUM , PROT, BILITOT, ALKPHOS, ALT, AST, GLUCOSE in the last 168 hours.  Invalid input(s): LABALBU No results found for: CKTOTAL, CKMB, CKMBINDEX, TROPONINI  Lab Results  Component Value Date   CHOL 130 03/22/2024   CHOL 117 09/09/2023   Lab Results  Component Value Date   HDL 57 03/22/2024   HDL 54 09/09/2023   Lab Results  Component Value Date   LDLCALC 57 03/22/2024   LDLCALC 42 09/09/2023   Lab Results  Component Value Date   TRIG 83 03/22/2024   TRIG 121 09/09/2023   No results found for: CHOLHDL No results found for: LDLDIRECT    Radiology: US  Abdomen Limited RUQ (LIVER/GB) Result Date: 05/19/2024 CLINICAL DATA:  MASLD.  HCC screening. EXAM: ULTRASOUND ABDOMEN LIMITED RIGHT UPPER QUADRANT COMPARISON:  MRI abdomen 11/11/2023. Ultrasound abdomen limited 11/05/2023 FINDINGS: Gallbladder: Status Post Cholecystectomy Common bile duct: Diameter: 3 mm Liver: Parenchymal echogenicity is mildly coarsened. Hepatic contours are subtly nodular. 1.4 x 1.2 x 1.1 cm septated cyst again seen in the LEFT hepatic lobe. 1.3 x 1.0 x 1.0 cm echogenic mass within the LEFT hepatic lobe corresponds to the hemangioma seen on MRI from 11/11/2023. No suspicious hepatic mass is identified. Portal vein is patent with Hepatopetal flow. Other: None. IMPRESSION: Cirrhotic liver morphology without focal hepatic lesion. Electronically Signed   By: Aliene Lloyd M.D.   On: 05/19/2024 15:24    EKG: 2019 SR rate 89 LAD poor R wave progression  06/02/2024 SR rate 98 PVC ICLBBB poor R wave progression    ASSESSMENT AND PLAN:   HTN;  historically on  diuretic and controlled  Chest Pain:  atypical chronically abnormal ECG Normal echo 05/20/23, normal myovue 05/27/23 and calcium  score 87.3, 58 th percentile On lipitor 10 mg daily LDL 57  Ortho:  prior left ankle fracture  with surgery F/U Duda NSAI's as needed  Abnormal ECG: ICLBBB poor R wave progression chronic with normal echo GI:  f/u Jamie Jensen Cirrhosis  MASLD weight loss good normal LFT;s no ETOH    F/U  in a year   Signed: Maude Emmer 06/02/2024, 9:46 AM

## 2024-05-20 NOTE — Telephone Encounter (Signed)
 Pt called regarding appointment scheduled 07/27/2024. Pt stated she can't wait two months and requesting sooner date. Scheduling has nothing available. Please advise. Thank you.

## 2024-05-20 NOTE — Telephone Encounter (Signed)
 Patient was rescheduled for an appointment with [facility] on 2-2. Scheduling to inform patient of sooner appointment. Patient has access to appointments on Mychart

## 2024-06-02 ENCOUNTER — Ambulatory Visit: Attending: Cardiovascular Disease | Admitting: Cardiovascular Disease

## 2024-06-02 ENCOUNTER — Encounter: Payer: Self-pay | Admitting: Cardiovascular Disease

## 2024-06-02 VITALS — BP 123/68 | HR 75 | Ht 60.0 in | Wt 171.0 lb

## 2024-06-02 DIAGNOSIS — R0789 Other chest pain: Secondary | ICD-10-CM | POA: Insufficient documentation

## 2024-06-02 DIAGNOSIS — I10 Essential (primary) hypertension: Secondary | ICD-10-CM | POA: Insufficient documentation

## 2024-06-02 DIAGNOSIS — R072 Precordial pain: Secondary | ICD-10-CM | POA: Diagnosis not present

## 2024-06-02 DIAGNOSIS — R079 Chest pain, unspecified: Secondary | ICD-10-CM | POA: Insufficient documentation

## 2024-06-02 NOTE — Patient Instructions (Signed)
 Medication Instructions:  Continue same medications *If you need a refill on your cardiac medications before your next appointment, please call your pharmacy*  Lab Work: None ordered If you have labs (blood work) drawn today and your tests are completely normal, you will receive your results only by: MyChart Message (if you have MyChart) OR A paper copy in the mail If you have any lab test that is abnormal or we need to change your treatment, we will call you to review the results.  Testing/Procedures: None orderd  Follow-Up: At [facility], you and your health needs are our priority.  As part of our continuing mission to provide you with exceptional heart care, our providers are all part of one team.  This team includes your primary Cardiologist (physician) and Advanced Practice Providers or APPs (Physician Assistants and Nurse Practitioners) who all work together to provide you with the care you need, when you need it.  Your next appointment:  1 year   Call in Oct to schedule Jan appointment     Provider:  Dr.Nishan   We recommend signing up for the patient portal called MyChart.  Sign up information is provided on this After Visit Summary.  MyChart is used to connect with patients for Virtual Visits (Telemedicine).  Patients are able to view lab/test results, encounter notes, upcoming appointments, etc.  Non-urgent messages can be sent to your provider as well.   To learn more about what you can do with MyChart, go to [email or web address].

## 2024-06-07 ENCOUNTER — Ambulatory Visit: Admitting: Nurse Practitioner

## 2024-06-09 ENCOUNTER — Ambulatory Visit: Admitting: Bariatrics

## 2024-06-09 ENCOUNTER — Encounter: Payer: Self-pay | Admitting: Bariatrics

## 2024-06-09 VITALS — BP 135/84 | HR 87 | Ht 60.0 in | Wt 164.0 lb

## 2024-06-09 DIAGNOSIS — Z6832 Body mass index (BMI) 32.0-32.9, adult: Secondary | ICD-10-CM | POA: Diagnosis not present

## 2024-06-09 DIAGNOSIS — E6609 Other obesity due to excess calories: Secondary | ICD-10-CM

## 2024-06-09 DIAGNOSIS — E669 Obesity, unspecified: Secondary | ICD-10-CM

## 2024-06-09 DIAGNOSIS — R7303 Prediabetes: Secondary | ICD-10-CM

## 2024-06-09 NOTE — Progress Notes (Signed)
 "                                                                                                             WEIGHT SUMMARY AND BIOMETRICS  Weight Lost Since Last Visit: 0  Weight Gained Since Last Visit: 2lb   Vitals BP: 135/84 Pulse Rate: 87 SpO2: 97 %   Anthropometric Measurements Height: 5' (1.524 m) Weight: 164 lb (74.4 kg) BMI (Calculated): 32.03 Weight at Last Visit: 162lb Weight Lost Since Last Visit: 0 Weight Gained Since Last Visit: 2lb Starting Weight: 178lb Total Weight Loss (lbs): 14 lb (6.35 kg)   Body Composition  Body Fat %: 38.3 % Fat Mass (lbs): 63 lbs Muscle Mass (lbs): 96.2 lbs Total Body Water (lbs): 65 lbs Visceral Fat Rating : 12   Other Clinical Data Fasting: no Labs: no Today's Visit #: 9 Starting Date: 09/09/23    OBESITY Jamie Jensen is here to discuss her progress with her obesity treatment plan along with follow-up of her obesity related diagnoses.    Nutrition Plan: the Category 2 plan - 0% adherence.  Current exercise: walking  Interim History:  She gained 2 lbs since her last visit. She is doing well overall.  According  to the bio-impedence scale, she is up 0.4 % of body fat, but also up 0.4 % of muscle.   Pharmacotherapy: Jamie Jensen is not on any anti-obesity medications.  Hunger is moderately controlled.  Cravings are moderately controlled.  Assessment/Plan:   Prediabetes Last A1c was 5.6  Medication(s): none Lab Results  Component Value Date   HGBA1C 5.6 03/22/2024   HGBA1C 5.9 (H) 08/14/2023   Lab Results  Component Value Date   INSULIN  15.9 03/22/2024   INSULIN  23.0 09/09/2023    Plan: Will minimize all refined carbohydrates both sweets and starches.  Will work on the plan and exercise.  Consider both aerobic and resistance training.  Will keep protein, water, and fiber intake high.  Increase Polyunsaturated and Monounsaturated fats to increase satiety and encourage weight loss.  Will continue her  exercise regimen. Will do more meal planning.  Will do more protein based meals.    Generalized Obesity: Current BMI BMI (Calculated): 32.03   Pharmacotherapy Plan No anti-obesity medications.   Jamie Jensen is currently in the action stage of change. As such, her goal is to continue with weight loss efforts.  She has agreed to the Category 2 plan.  Exercise goals: Older adults should follow the adult guidelines. When older adults cannot meet the adult guidelines, they should be as physically active as their abilities and conditions will allow.   Behavioral modification strategies: increasing lean protein intake, no meal skipping, meal planning , increase water intake, better snacking choices, planning for success, increasing vegetables, keep healthy foods in the home, increase frequency of journaling, weigh protein portions, and measure portion sizes.  Jamie Jensen has agreed to follow-up with our clinic in 4 weeks.     Objective:   VITALS: Per patient if applicable, see vitals. GENERAL: Alert and in no acute distress. CARDIOPULMONARY:  No increased WOB. Speaking in clear sentences.  PSYCH: Pleasant and cooperative. Speech normal rate and rhythm. Affect is appropriate. Insight and judgement are appropriate. Attention is focused, linear, and appropriate.  NEURO: Oriented as arrived to appointment on time with no prompting.   Attestation Statements:   This was prepared with the assistance of Engineer, Civil (consulting).  Occasional wrong-word or sound-a-like substitutions may have occurred due to the inherent limitations of voice recognition.   Clayborne Daring, DO    "

## 2024-06-18 ENCOUNTER — Ambulatory Visit: Admitting: Gastroenterology

## 2024-07-07 ENCOUNTER — Ambulatory Visit: Admitting: Bariatrics

## 2024-07-27 ENCOUNTER — Ambulatory Visit: Admitting: Gastroenterology

## 2024-08-12 ENCOUNTER — Ambulatory Visit: Admitting: Gastroenterology
